# Patient Record
Sex: Female | Born: 1985 | Race: White | Hispanic: No | Marital: Married | Smoking: Never smoker
Health system: Southern US, Community
[De-identification: ages and names within clinical notes are randomized; demographics above are authoritative.]

## PROBLEM LIST (undated history)

## (undated) DIAGNOSIS — K900 Celiac disease: Secondary | ICD-10-CM

## (undated) DIAGNOSIS — D649 Anemia, unspecified: Secondary | ICD-10-CM

## (undated) DIAGNOSIS — Z789 Other specified health status: Secondary | ICD-10-CM

## (undated) HISTORY — DX: Celiac disease: K90.0

## (undated) HISTORY — DX: Anemia, unspecified: D64.9

---

## 2007-12-11 HISTORY — PX: LASIK: SHX215

## 2014-09-22 ENCOUNTER — Ambulatory Visit (INDEPENDENT_AMBULATORY_CARE_PROVIDER_SITE_OTHER): Payer: Self-pay | Admitting: Surgery

## 2014-09-22 NOTE — H&P (Deleted)
History of Present Illness Imogene Burn. Marieme Mcmackin MD; 09/20/2014 1:48 PM) Patient words: eval left breast.  The patient is a 28 year old female who presents with a complaint of nipple discharge. Referred by Dr. Glendon Axe [facility] for evaluation of left nipple drainage  This is a 28 yo female who has recently moved to the area from [place] who presents with several years of intermittent left bloody nipple discharge. She also has tenderness in the left upper outer quadrant of the breast that seems to cycle with her menstrual cycle. She had previous work-up in [place] including mammograms and ultrasounds which were reportedly normal. She had a small lesion excised from her left nipple that was reportedly benign, but the drainage continues. On 09/08/14, she underwent left breast ultrasound at [facility] that showed 2 hypoechoic masses in the 2:30 position, 4 cm from the nipple. One measures 10 mmx 8 mm and the other 8 mm x 5.7 mm. She is otherwise healthy.  Menarche - age 58 Nulliparous No hormones FH - paternal grandmother LMP - 09/06/14   Other Problems [name], CMA; 09/20/2014 10:34 AM) No pertinent past medical history  Past Surgical History [name], [place]; 09/20/2014 10:34 AM) No pertinent past surgical history  Diagnostic Studies History [name], CMA; 09/20/2014 10:34 AM) Colonoscopy never Mammogram >3 years ago  Allergies [name], CMA; 09/20/2014 10:34 AM) Amoxicillin *PENICILLINS*  Medication History ([name], CMA; 09/20/2014 10:34 AM) No Current Medications  Social History [name], CMA; 09/20/2014 10:34 AM) Caffeine use Coffee, Tea. No alcohol use No drug use Tobacco use Never smoker.  Family History [name], [place]; 09/20/2014 10:34 AM) Breast Cancer Family Members In General.  Pregnancy / Birth History [name], [place]; 09/20/2014 10:34 AM) Age at menarche 40 years. Gravida 0 Irregular periods Para  0  Review of Systems ([name] CMA; 09/20/2014 10:34 AM) General Not Present- Appetite Loss, Chills, Fatigue, Fever, Night Sweats, Weight Gain and Weight Loss. Skin Not Present- Change in Wart/Mole, Dryness, Hives, Jaundice, New Lesions, Non-Healing Wounds, Rash and Ulcer. HEENT Present- Seasonal Allergies, Sinus Pain and Wears glasses/contact lenses. Not Present- Earache, Hearing Loss, Hoarseness, Nose Bleed, Oral Ulcers, Ringing in the Ears, Sore Throat, Visual Disturbances and Yellow Eyes. Respiratory Not Present- Bloody sputum, Chronic Cough, Difficulty Breathing, Snoring and Wheezing. Breast Present- Breast Pain and Nipple Discharge. Not Present- Breast Mass and Skin Changes. Cardiovascular Not Present- Chest Pain, Difficulty Breathing Lying Down, Leg Cramps, Palpitations, Rapid Heart Rate, Shortness of Breath and Swelling of Extremities. Gastrointestinal Not Present- Abdominal Pain, Bloating, Bloody Stool, Change in Bowel Habits, Chronic diarrhea, Constipation, Difficulty Swallowing, Excessive gas, Gets full quickly at meals, Hemorrhoids, Indigestion, Nausea, Rectal Pain and Vomiting. Female Genitourinary Present- Nocturia. Not Present- Frequency, Painful Urination, Pelvic Pain and Urgency. Musculoskeletal Not Present- Back Pain, Joint Pain, Joint Stiffness, Muscle Pain, Muscle Weakness and Swelling of Extremities. Neurological Not Present- Decreased Memory, Fainting, Headaches, Numbness, Seizures, Tingling, Tremor, Trouble walking and Weakness. Psychiatric Not Present- Anxiety, Bipolar, Change in Sleep Pattern, Depression, Fearful and Frequent crying. Endocrine Not Present- Cold Intolerance, Excessive Hunger, Hair Changes, Heat Intolerance, Hot flashes and New Diabetes. Hematology Not Present- Easy Bruising, Excessive bleeding, Gland problems, HIV and Persistent Infections.   Vitals ([name] CMA; 09/20/2014 10:35 AM) 09/20/2014 10:34 AM Weight: 91 lb Height: 63in Body Surface  Area: 1.35 m Body Mass Index: 16.12 kg/m Temp.: 97.70F(Temporal)  Pulse: 74 (Regular)  BP: 112/70 (Sitting, Left Arm, Standard)    Physical Exam [name]  [name] MD; 09/20/2014 1:49 PM) The physical exam findings are as follows: Note:WDWN in NAD HEENT: EOMI, sclera anicteric Neck: No masses, no thyromegaly Lungs: CTA bilaterally; normal respiratory effort Breasts: symmetric; bilateral fibrocystic changes; mildly tender in LUOQ with palpable 1 cm masses; no skin changes Right nipple - no retraction or discharge Left nipple - no retraction; the center of nipple shows an opening with some mild irritation and some crusting; unable to express any discharge today. CV: Regular rate and rhythm; no murmurs Abd: +bowel sounds, soft, non-tender, no masses Ext: Well-perfused; no edema Skin: Warm, dry; no sign of jaundice    Assessment & Plan [name] K. Dylynn Ketner MD; 09/20/2014 1:50 PM) BLOODY DISCHARGE FROM LEFT NIPPLE (611.79  N64.52) Current Plans  Schedule for Surgery Note:Recommend left nipple duct excision for biopsy. 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 The patient and her husband want to think about it and will call us back with their decision about surgery. They have an appointment with their PCP this afternoon and wish to discuss this further.   Imogene Burn. [name], MD, [facility] Surgery  General/ Trauma Surgery  09/22/2014 9:35 PM

## 2014-09-22 NOTE — H&P (Signed)
History of Present Illness Candice Ward. Candice Salonga MD; 09/20/2014 1:48 PM) Patient words: eval left breast.  The patient is a 28 year old female who presents with a complaint of nipple discharge. Referred by Dr. Glendon Ward [facility] for evaluation of left nipple drainage  This is a 28 yo female who has recently moved to the area from [place] who presents with several years of intermittent left bloody nipple discharge. She also has tenderness in the left upper outer quadrant of the breast that seems to cycle with her menstrual cycle. She had previous work-up in [place] including mammograms and ultrasounds which were reportedly normal. She had a small lesion excised from her left nipple that was reportedly benign, but the drainage continues. On 09/08/14, she underwent left breast ultrasound at [facility] that showed 2 hypoechoic masses in the 2:30 position, 4 cm from the nipple. One measures 10 mmx 8 mm and the other 8 mm x 5.7 mm. She is otherwise healthy.  Menarche - age 29 Nulliparous No hormones FH - paternal grandmother LMP - 09/06/14   Other Problems Candice Ward, CMA; 09/20/2014 10:34 AM) No pertinent past medical history  Past Surgical History Candice Ward, Candice Ward; 09/20/2014 10:34 AM) No pertinent past surgical history  Diagnostic Studies History Candice Ward, CMA; 09/20/2014 10:34 AM) Colonoscopy never Mammogram >3 years ago  Allergies Candice Ward, CMA; 09/20/2014 10:34 AM) Amoxicillin *PENICILLINS*  Medication History (Candice Ward, CMA; 09/20/2014 10:34 AM) No Current Medications  Social History Candice Ward, CMA; 09/20/2014 10:34 AM) Caffeine use Coffee, Tea. No alcohol use No drug use Tobacco use Never smoker.  Family History Candice Ward, Candice Ward; 09/20/2014 10:34 AM) Breast Cancer Family Members In General.  Pregnancy / Birth History Candice Ward, Candice Ward; 09/20/2014 10:34 AM) Age at menarche 55 years. Gravida 0 Irregular periods Para  0  Review of Systems (Candice Ward CMA; 09/20/2014 10:34 AM) General Not Present- Appetite Loss, Chills, Fatigue, Fever, Night Sweats, Weight Gain and Weight Loss. Skin Not Present- Change in Wart/Mole, Dryness, Hives, Jaundice, New Lesions, Non-Healing Wounds, Rash and Ulcer. HEENT Present- Seasonal Allergies, Sinus Pain and Wears glasses/contact lenses. Not Present- Earache, Hearing Loss, Hoarseness, Nose Bleed, Oral Ulcers, Ringing in the Ears, Sore Throat, Visual Disturbances and Yellow Eyes. Respiratory Not Present- Bloody sputum, Chronic Cough, Difficulty Breathing, Snoring and Wheezing. Breast Present- Breast Pain and Nipple Discharge. Not Present- Breast Mass and Skin Changes. Cardiovascular Not Present- Chest Pain, Difficulty Breathing Lying Down, Leg Cramps, Palpitations, Rapid Heart Rate, Shortness of Breath and Swelling of Extremities. Gastrointestinal Not Present- Abdominal Pain, Bloating, Bloody Stool, Change in Bowel Habits, Chronic diarrhea, Constipation, Difficulty Swallowing, Excessive gas, Gets full quickly at meals, Hemorrhoids, Indigestion, Nausea, Rectal Pain and Vomiting. Female Genitourinary Present- Nocturia. Not Present- Frequency, Painful Urination, Pelvic Pain and Urgency. Musculoskeletal Not Present- Back Pain, Joint Pain, Joint Stiffness, Muscle Pain, Muscle Weakness and Swelling of Extremities. Neurological Not Present- Decreased Memory, Fainting, Headaches, Numbness, Seizures, Tingling, Tremor, Trouble walking and Weakness. Psychiatric Not Present- Anxiety, Bipolar, Change in Sleep Pattern, Depression, Fearful and Frequent crying. Endocrine Not Present- Cold Intolerance, Excessive Hunger, Hair Changes, Heat Intolerance, Hot flashes and New Diabetes. Hematology Not Present- Easy Bruising, Excessive bleeding, Gland problems, HIV and Persistent Infections.   Vitals (Candice Ward CMA; 09/20/2014 10:35 AM) 09/20/2014 10:34 AM Weight: 91 lb Height: 63in Body Surface  Area: 1.35 m Body Mass Index: 16.12 kg/m Temp.: 97.73F(Temporal)  Pulse: 74 (Regular)  BP: 112/70 (Sitting, Left Arm, Standard)    Physical Exam Candice Ward  Candice Section MD; 09/20/2014 1:49 PM) The physical exam findings are as follows: Note:WDWN in NAD HEENT: EOMI, sclera anicteric Neck: No masses, no thyromegaly Lungs: CTA bilaterally; normal respiratory effort Breasts: symmetric; bilateral fibrocystic changes; mildly tender in LUOQ with palpable 1 cm masses; no skin changes Right nipple - no retraction or discharge Left nipple - no retraction; the center of nipple shows an opening with some mild irritation and some crusting; unable to express any discharge today. CV: Regular rate and rhythm; no murmurs Abd: +bowel sounds, soft, non-tender, no masses Ext: Well-perfused; no edema Skin: Warm, dry; no sign of jaundice    Assessment & Plan Candice Ward K. Candice Henkels MD; 09/20/2014 1:50 PM) BLOODY DISCHARGE FROM LEFT NIPPLE (611.79  N64.52) Current Plans  Schedule for Surgery Note:Recommend left nipple duct excision for biopsy. 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 The patient and her husband want to think about it and will call us back with their decision about surgery.    Candice Ward. Candice Dover, MD, [facility] Surgery  General/ Trauma Surgery  09/22/2014 9:36 PM

## 2014-09-28 ENCOUNTER — Encounter (HOSPITAL_COMMUNITY): Payer: Self-pay | Admitting: Pharmacy Technician

## 2014-10-01 ENCOUNTER — Encounter (HOSPITAL_COMMUNITY): Payer: Self-pay

## 2014-10-01 ENCOUNTER — Encounter (HOSPITAL_COMMUNITY)
Admission: RE | Admit: 2014-10-01 | Discharge: 2014-10-01 | Disposition: A | Discharge status: Home / Self Care | Payer: Medicaid Other | Source: Ambulatory Visit | Attending: Surgery | Admitting: Surgery

## 2014-10-01 DIAGNOSIS — Z01812 Encounter for preprocedural laboratory examination: Secondary | ICD-10-CM | POA: Insufficient documentation

## 2014-10-01 HISTORY — DX: Other specified health status: Z78.9

## 2014-10-01 LAB — CBC
HCT: 34.5 % — ABNORMAL LOW (ref 36.0–46.0)
Hemoglobin: 11.2 g/dL — ABNORMAL LOW (ref 12.0–15.0)
MCH: 27.5 pg (ref 26.0–34.0)
MCHC: 32.5 g/dL (ref 30.0–36.0)
MCV: 84.8 fL (ref 78.0–100.0)
Platelets: 225 10*3/uL (ref 150–400)
RBC: 4.07 MIL/uL (ref 3.87–5.11)
RDW: 13.5 % (ref 11.5–15.5)
WBC: 8.3 10*3/uL (ref 4.0–10.5)

## 2014-10-01 LAB — HCG, SERUM, QUALITATIVE: Preg, Serum: NEGATIVE

## 2014-10-01 NOTE — Pre-Procedure Instructions (Signed)
Candice Ward  10/01/2014   Your procedure is scheduled on: Tuesday, October 05, 2014  Report to [facility] Admitting at 8:30 AM.  Call this number if you have problems the morning of surgery: [phone]   Remember:   Do not eat food or drink liquids after midnight Monday, October 04, 2014   Take these medicines the morning of surgery with A SIP OF WATER:   fluticasone (FLONASE) nasal spray  Stop taking Aspirin, vitamins, and herbal medications. Do not take any NSAIDs ie: Ibuprofen, Advil, Naproxen or any medication containing Aspirin; stop now.   Do not wear jewelry, make-up or nail polish.  Do not wear lotions, powders, or perfumes. You may not wear deodorant.  Do not shave 48 hours prior to surgery.   Do not bring valuables to the hospital.  [facility] is not responsible  for any belongings or valuables.               Contacts, dentures or bridgework may not be worn into surgery.  Leave suitcase in the car. After surgery it may be brought to your room.  For patients admitted to the hospital, discharge time is determined by your  treatment team.               Patients discharged the day of surgery will not be allowed to drive home.  Name and phone number of your driver:   Special Instructions:  Special Instructions:Special Instructions: [facility] - Preparing for Surgery  Before surgery, you can play an important role.  Because skin is not sterile, your skin needs to be as free of germs as possible.  You can reduce the number of germs on you skin by washing with CHG (chlorahexidine gluconate) soap before surgery.  CHG is an antiseptic cleaner which kills germs and bonds with the skin to continue killing germs even after washing.  Please DO NOT use if you have an allergy to CHG or antibacterial soaps.  If your skin becomes reddened/irritated stop using the CHG and inform your nurse when you arrive at Short Stay.  Do not shave (including legs and underarms) for at  least 48 hours prior to the first CHG shower.  You may shave your face.  Please follow these instructions carefully:   1.  Shower with CHG Soap the night before surgery and the morning of Surgery.  2.  If you choose to wash your hair, wash your hair first as usual with your normal shampoo.  3.  After you shampoo, rinse your hair and body thoroughly to remove the Shampoo.  4.  Use CHG as you would any other liquid soap.  You can apply chg directly  to the skin and wash gently with scrungie or a clean washcloth.  5.  Apply the CHG Soap to your body ONLY FROM THE NECK DOWN.  Do not use on open wounds or open sores.  Avoid contact with your eyes, ears, mouth and genitals (private parts).  Wash genitals (private parts) with your normal soap.  6.  Wash thoroughly, paying special attention to the area where your surgery will be performed.  7.  Thoroughly rinse your body with warm water from the neck down.  8.  DO NOT shower/wash with your normal soap after using and rinsing off the CHG Soap.  9.  Pat yourself dry with a clean towel.            10.  Wear clean pajamas.  11.  Place clean sheets on your bed the night of your first shower and do not sleep with pets.  Day of Surgery  Do not apply any lotions/deodorants the morning of surgery.  Please wear clean clothes to the hospital/surgery center.   Please read over the following fact sheets that you were given: Pain Booklet, Coughing and Deep Breathing and Surgical Site Infection Prevention

## 2014-10-04 MED ORDER — CEFAZOLIN SODIUM-DEXTROSE 2-3 GM-% IV SOLR
2.0000 g | INTRAVENOUS | Status: AC
  Administered 2014-10-05: 2 g via INTRAVENOUS
  Filled 2014-10-04: qty 50

## 2014-10-05 ENCOUNTER — Ambulatory Visit (HOSPITAL_COMMUNITY): Payer: Medicaid Other | Admitting: Certified Registered"

## 2014-10-05 ENCOUNTER — Ambulatory Visit (HOSPITAL_COMMUNITY)
Admission: RE | Admit: 2014-10-05 | Discharge: 2014-10-05 | Disposition: A | Discharge status: Home / Self Care | Payer: Medicaid Other | Source: Ambulatory Visit | Attending: Surgery | Admitting: Surgery

## 2014-10-05 ENCOUNTER — Encounter (HOSPITAL_COMMUNITY): Payer: Medicaid Other | Admitting: Certified Registered"

## 2014-10-05 ENCOUNTER — Encounter (HOSPITAL_COMMUNITY)
Admission: RE | Disposition: A | Discharge status: Home / Self Care | Payer: Self-pay | Source: Ambulatory Visit | Attending: Surgery

## 2014-10-05 ENCOUNTER — Encounter (HOSPITAL_COMMUNITY): Payer: Self-pay | Admitting: *Deleted

## 2014-10-05 DIAGNOSIS — Z880 Allergy status to penicillin: Secondary | ICD-10-CM | POA: Diagnosis not present

## 2014-10-05 DIAGNOSIS — D242 Benign neoplasm of left breast: Secondary | ICD-10-CM | POA: Insufficient documentation

## 2014-10-05 DIAGNOSIS — N6452 Nipple discharge: Secondary | ICD-10-CM | POA: Insufficient documentation

## 2014-10-05 DIAGNOSIS — Z881 Allergy status to other antibiotic agents status: Secondary | ICD-10-CM | POA: Insufficient documentation

## 2014-10-05 HISTORY — PX: BREAST DUCTAL SYSTEM EXCISION: SHX5242

## 2014-10-05 SURGERY — EXCISION DUCTAL SYSTEM BREAST
Anesthesia: General | Site: Breast | Laterality: Left

## 2014-10-05 MED ORDER — PROPOFOL 10 MG/ML IV BOLUS
INTRAVENOUS | Status: AC
  Filled 2014-10-05: qty 20

## 2014-10-05 MED ORDER — PROMETHAZINE HCL 25 MG/ML IJ SOLN: 6.2500 mg | INTRAMUSCULAR | Status: DC | PRN

## 2014-10-05 MED ORDER — ONDANSETRON HCL 4 MG/2ML IJ SOLN: 4.0000 mg | INTRAMUSCULAR | Status: DC | PRN

## 2014-10-05 MED ORDER — DEXAMETHASONE SODIUM PHOSPHATE 10 MG/ML IJ SOLN
INTRAMUSCULAR | Status: DC | PRN
  Administered 2014-10-05: 4 mg via INTRAVENOUS

## 2014-10-05 MED ORDER — SUCCINYLCHOLINE CHLORIDE 20 MG/ML IJ SOLN
INTRAMUSCULAR | Status: AC
  Filled 2014-10-05: qty 1

## 2014-10-05 MED ORDER — OXYCODONE HCL 5 MG PO TABS: 5.0000 mg | ORAL_TABLET | Freq: Once | ORAL | Status: DC | PRN

## 2014-10-05 MED ORDER — MORPHINE SULFATE 2 MG/ML IJ SOLN: 2.0000 mg | INTRAMUSCULAR | Status: DC | PRN

## 2014-10-05 MED ORDER — MIDAZOLAM HCL 2 MG/2ML IJ SOLN
INTRAMUSCULAR | Status: AC
  Filled 2014-10-05: qty 2

## 2014-10-05 MED ORDER — FENTANYL CITRATE 0.05 MG/ML IJ SOLN
INTRAMUSCULAR | Status: DC | PRN
  Administered 2014-10-05 (×4): 25 ug via INTRAVENOUS

## 2014-10-05 MED ORDER — ROCURONIUM BROMIDE 50 MG/5ML IV SOLN
INTRAVENOUS | Status: AC
  Filled 2014-10-05: qty 1

## 2014-10-05 MED ORDER — LACTATED RINGERS IV SOLN
INTRAVENOUS | Status: DC | PRN
  Administered 2014-10-05: 10:00:00 via INTRAVENOUS

## 2014-10-05 MED ORDER — ONDANSETRON HCL 4 MG/2ML IJ SOLN
INTRAMUSCULAR | Status: DC | PRN
Start: 2014-10-05 — End: 2014-10-05
  Administered 2014-10-05: 4 mg via INTRAVENOUS

## 2014-10-05 MED ORDER — HYDROCODONE-ACETAMINOPHEN 5-325 MG PO TABS: 1.0000 | ORAL_TABLET | ORAL | Status: DC | PRN

## 2014-10-05 MED ORDER — OXYCODONE HCL 5 MG/5ML PO SOLN: 5.0000 mg | Freq: Once | ORAL | Status: DC | PRN

## 2014-10-05 MED ORDER — LIDOCAINE HCL (CARDIAC) 20 MG/ML IV SOLN
INTRAVENOUS | Status: DC | PRN
  Administered 2014-10-05: 80 mg via INTRAVENOUS

## 2014-10-05 MED ORDER — BUPIVACAINE HCL (PF) 0.25 % IJ SOLN
INTRAMUSCULAR | Status: AC
  Filled 2014-10-05: qty 30

## 2014-10-05 MED ORDER — BUPIVACAINE HCL (PF) 0.25 % IJ SOLN
INTRAMUSCULAR | Status: DC | PRN
  Administered 2014-10-05: 10 mL

## 2014-10-05 MED ORDER — ONDANSETRON HCL 4 MG/2ML IJ SOLN
INTRAMUSCULAR | Status: AC
  Filled 2014-10-05: qty 2

## 2014-10-05 MED ORDER — PHENYLEPHRINE HCL 10 MG/ML IJ SOLN
INTRAMUSCULAR | Status: DC | PRN
  Administered 2014-10-05: 40 ug via INTRAVENOUS

## 2014-10-05 MED ORDER — LACTATED RINGERS IV SOLN
INTRAVENOUS | Status: DC
  Administered 2014-10-05: 50 mL/h via INTRAVENOUS

## 2014-10-05 MED ORDER — FENTANYL CITRATE 0.05 MG/ML IJ SOLN
25.0000 ug | INTRAMUSCULAR | Status: DC | PRN
  Administered 2014-10-05 (×2): 25 ug via INTRAVENOUS

## 2014-10-05 MED ORDER — CHLORHEXIDINE GLUCONATE 4 % EX LIQD
1.0000 "application " | Freq: Once | CUTANEOUS | Status: DC
  Filled 2014-10-05: qty 15

## 2014-10-05 MED ORDER — FENTANYL CITRATE 0.05 MG/ML IJ SOLN
INTRAMUSCULAR | Status: AC
  Filled 2014-10-05: qty 5

## 2014-10-05 MED ORDER — 0.9 % SODIUM CHLORIDE (POUR BTL) OPTIME
TOPICAL | Status: DC | PRN
  Administered 2014-10-05: 1000 mL

## 2014-10-05 MED ORDER — FENTANYL CITRATE 0.05 MG/ML IJ SOLN
INTRAMUSCULAR | Status: AC
  Filled 2014-10-05: qty 2

## 2014-10-05 MED ORDER — BUPIVACAINE-EPINEPHRINE (PF) 0.25% -1:200000 IJ SOLN
INTRAMUSCULAR | Status: AC
  Filled 2014-10-05: qty 30

## 2014-10-05 MED ORDER — MIDAZOLAM HCL 5 MG/5ML IJ SOLN
INTRAMUSCULAR | Status: DC | PRN
  Administered 2014-10-05: 2 mg via INTRAVENOUS

## 2014-10-05 MED ORDER — PROPOFOL 10 MG/ML IV BOLUS
INTRAVENOUS | Status: DC | PRN
  Administered 2014-10-05: 150 mg via INTRAVENOUS

## 2014-10-05 SURGICAL SUPPLY — 46 items
BENZOIN TINCTURE PRP APPL 2/3 (GAUZE/BANDAGES/DRESSINGS) ×3 IMPLANT
CANISTER SUCTION 2500CC (MISCELLANEOUS) ×3 IMPLANT
CHLORAPREP W/TINT 26ML (MISCELLANEOUS) ×3 IMPLANT
CLOSURE WOUND 1/2 X4 (GAUZE/BANDAGES/DRESSINGS) ×1
CONT SPEC 4OZ CLIKSEAL STRL BL (MISCELLANEOUS) ×3 IMPLANT
COVER SURGICAL LIGHT HANDLE (MISCELLANEOUS) ×3 IMPLANT
DRAPE LAPAROSCOPIC ABDOMINAL (DRAPES) ×3 IMPLANT
DRAPE PED LAPAROTOMY (DRAPES) ×3 IMPLANT
DRAPE UTILITY 15X26 W/TAPE STR (DRAPE) ×6 IMPLANT
DRSG TEGADERM 4X4.75 (GAUZE/BANDAGES/DRESSINGS) ×3 IMPLANT
ELECT CAUTERY BLADE 6.4 (BLADE) ×3 IMPLANT
ELECT REM PT RETURN 9FT ADLT (ELECTROSURGICAL) ×3
ELECTRODE REM PT RTRN 9FT ADLT (ELECTROSURGICAL) ×1 IMPLANT
GAUZE SPONGE 2X2 8PLY STRL LF (GAUZE/BANDAGES/DRESSINGS) ×1 IMPLANT
GLOVE BIO SURGEON STRL SZ7 (GLOVE) ×3 IMPLANT
GLOVE BIOGEL PI IND STRL 7.0 (GLOVE) ×3 IMPLANT
GLOVE BIOGEL PI IND STRL 7.5 (GLOVE) ×1 IMPLANT
GLOVE BIOGEL PI INDICATOR 7.0 (GLOVE) ×6
GLOVE BIOGEL PI INDICATOR 7.5 (GLOVE) ×2
GLOVE SURG SS PI 7.0 STRL IVOR (GLOVE) ×6 IMPLANT
GOWN STRL REUS W/ TWL LRG LVL3 (GOWN DISPOSABLE) ×2 IMPLANT
GOWN STRL REUS W/TWL LRG LVL3 (GOWN DISPOSABLE) ×4
KIT BASIN OR (CUSTOM PROCEDURE TRAY) ×3 IMPLANT
KIT MARKER MARGIN INK (KITS) ×3 IMPLANT
KIT ROOM TURNOVER OR (KITS) ×3 IMPLANT
NEEDLE HYPO 25GX1X1/2 BEV (NEEDLE) ×3 IMPLANT
NS IRRIG 1000ML POUR BTL (IV SOLUTION) ×3 IMPLANT
PACK SURGICAL SETUP 50X90 (CUSTOM PROCEDURE TRAY) ×3 IMPLANT
PAD ARMBOARD 7.5X6 YLW CONV (MISCELLANEOUS) ×3 IMPLANT
PENCIL BUTTON HOLSTER BLD 10FT (ELECTRODE) ×3 IMPLANT
SPECIMEN JAR MEDIUM (MISCELLANEOUS) ×3 IMPLANT
SPONGE GAUZE 2X2 STER 10/PKG (GAUZE/BANDAGES/DRESSINGS) ×2
SPONGE LAP 4X18 X RAY DECT (DISPOSABLE) ×3 IMPLANT
STRIP CLOSURE SKIN 1/2X4 (GAUZE/BANDAGES/DRESSINGS) ×2 IMPLANT
SUT MON AB 4-0 PC3 18 (SUTURE) ×3 IMPLANT
SUT MON AB 5-0 P3 18 (SUTURE) ×3 IMPLANT
SUT VIC AB 3-0 SH 27 (SUTURE) ×2
SUT VIC AB 3-0 SH 27X BRD (SUTURE) ×1 IMPLANT
SYR BULB 3OZ (MISCELLANEOUS) ×3 IMPLANT
SYR CONTROL 10ML LL (SYRINGE) ×3 IMPLANT
TOWEL OR 17X24 6PK STRL BLUE (TOWEL DISPOSABLE) IMPLANT
TOWEL OR 17X26 10 PK STRL BLUE (TOWEL DISPOSABLE) ×3 IMPLANT
TUBE CONNECTING 12'X1/4 (SUCTIONS) ×1
TUBE CONNECTING 12X1/4 (SUCTIONS) ×2 IMPLANT
WATER STERILE IRR 1000ML POUR (IV SOLUTION) IMPLANT
YANKAUER SUCT BULB TIP NO VENT (SUCTIONS) ×3 IMPLANT

## 2014-10-05 NOTE — Anesthesia Procedure Notes (Addendum)
Procedure Name: LMA Insertion Date/Time: 10/05/2014 10:31 AM Performed by: Maeola Harman Pre-anesthesia Checklist: Patient identified, Emergency Drugs available, Suction available, Patient being monitored and Timeout performed Patient Re-evaluated:Patient Re-evaluated prior to inductionOxygen Delivery Method: Circle system utilized Preoxygenation: Pre-oxygenation with 100% oxygen Intubation Type: IV induction Ventilation: Mask ventilation without difficulty LMA: LMA inserted LMA Size: 4.0 Tube type: Oral Number of attempts: 1 Placement Confirmation: breath sounds checked- equal and bilateral and positive ETCO2 Tube secured with: Tape Dental Injury: Teeth and Oropharynx as per pre-operative assessment  Comments: Easy induction and LMA insertion #4.  Dr Orene Desanctis verified placement.  Waldron Session, CRNA

## 2014-10-05 NOTE — H&P (Signed)
History of Present Illness  Patient words: eval left breast.  The patient is a 28 year old female who presents with a complaint of nipple discharge. Referred by Dr. Glendon Axe [facility] for evaluation of left nipple drainage  This is a 28 yo female who has recently moved to the area from [place] who presents with several years of intermittent left bloody nipple discharge. She also has tenderness in the left upper outer quadrant of the breast that seems to cycle with her menstrual cycle. She had previous work-up in [place] including mammograms and ultrasounds which were reportedly normal. She had a small lesion excised from her left nipple that was reportedly benign, but the drainage continues. On 09/08/14, she underwent left breast ultrasound at [facility] that showed 2 hypoechoic masses in the 2:30 position, 4 cm from the nipple. One measures 10 mmx 8 mm and the other 8 mm x 5.7 mm. She is otherwise healthy.  Menarche - age 39 Nulliparous No hormones FH - paternal grandmother LMP - 09/06/14   Other Problems [name], CMA; 09/20/2014 10:34 AM) No pertinent past medical history  Past Surgical History [name], [place]; 09/20/2014 10:34 AM) No pertinent past surgical history  Diagnostic Studies History [name], CMA; 09/20/2014 10:34 AM) Colonoscopy never Mammogram >3 years ago  Allergies [name], CMA; 09/20/2014 10:34 AM) Amoxicillin *PENICILLINS*  Medication History ([name], CMA; 09/20/2014 10:34 AM) No Current Medications  Social History [name], CMA; 09/20/2014 10:34 AM) Caffeine use Coffee, Tea. No alcohol use No drug use Tobacco use Never smoker.  Family History [name], [place]; 09/20/2014 10:34 AM) Breast Cancer Family Members In General.  Pregnancy / Birth History [name], [place]; 09/20/2014 10:34 AM) Age at menarche 79 years. Gravida 0 Irregular periods Para 0  Review of Systems ([name] CMA;  09/20/2014 10:34 AM) General Not Present- Appetite Loss, Chills, Fatigue, Fever, Night Sweats, Weight Gain and Weight Loss. Skin Not Present- Change in Wart/Mole, Dryness, Hives, Jaundice, New Lesions, Non-Healing Wounds, Rash and Ulcer. HEENT Present- Seasonal Allergies, Sinus Pain and Wears glasses/contact lenses. Not Present- Earache, Hearing Loss, Hoarseness, Nose Bleed, Oral Ulcers, Ringing in the Ears, Sore Throat, Visual Disturbances and Yellow Eyes. Respiratory Not Present- Bloody sputum, Chronic Cough, Difficulty Breathing, Snoring and Wheezing. Breast Present- Breast Pain and Nipple Discharge. Not Present- Breast Mass and Skin Changes. Cardiovascular Not Present- Chest Pain, Difficulty Breathing Lying Down, Leg Cramps, Palpitations, Rapid Heart Rate, Shortness of Breath and Swelling of Extremities. Gastrointestinal Not Present- Abdominal Pain, Bloating, Bloody Stool, Change in Bowel Habits, Chronic diarrhea, Constipation, Difficulty Swallowing, Excessive gas, Gets full quickly at meals, Hemorrhoids, Indigestion, Nausea, Rectal Pain and Vomiting. Female Genitourinary Present- Nocturia. Not Present- Frequency, Painful Urination, Pelvic Pain and Urgency. Musculoskeletal Not Present- Back Pain, Joint Pain, Joint Stiffness, Muscle Pain, Muscle Weakness and Swelling of Extremities. Neurological Not Present- Decreased Memory, Fainting, Headaches, Numbness, Seizures, Tingling, Tremor, Trouble walking and Weakness. Psychiatric Not Present- Anxiety, Bipolar, Change in Sleep Pattern, Depression, Fearful and Frequent crying. Endocrine Not Present- Cold Intolerance, Excessive Hunger, Hair Changes, Heat Intolerance, Hot flashes and New Diabetes. Hematology Not Present- Easy Bruising, Excessive bleeding, Gland problems, HIV and Persistent Infections.   Vitals ([name] CMA; 09/20/2014 10:35 AM) 09/20/2014 10:34 AM Weight: 91 lb Height: 63in Body Surface Area: 1.35 m Body Mass Index: 16.12  kg/m Temp.: 97.90F(Temporal)  Pulse: 74 (Regular)  BP: 112/70 (Sitting, Left Arm, Standard)    Physical Exam [name] K. Toneshia Coello MD; 09/20/2014 1:49 PM)  The physical exam findings are as follows: Note:WDWN in NAD HEENT: EOMI, sclera anicteric Neck: No masses, no thyromegaly Lungs: CTA bilaterally; normal respiratory effort Breasts: symmetric; bilateral fibrocystic changes; mildly tender in LUOQ with palpable 1 cm masses; no skin changes Right nipple - no retraction or discharge Left nipple - no retraction; the center of nipple shows an opening with some mild irritation and some crusting; unable to express any discharge today. CV: Regular rate and rhythm; no murmurs Abd: +bowel sounds, soft, non-tender, no masses Ext: Well-perfused; no edema Skin: Warm, dry; no sign of jaundice    Assessment & Plan [name] K. Annabel Gibeau MD; 09/20/2014 1:50 PM) BLOODY DISCHARGE FROM LEFT NIPPLE (611.79  N64.52) Current Plans  Schedule for Surgery Note:Recommend left nipple duct excision for biopsy. 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  Our main concern is to determine the cause of her nipple discharge.  We will proceed only with left nipple duct excision.  The surgical procedure has been discussed with the patient.  Potential risks, benefits, alternative treatments, and expected outcomes have been explained.  All of the patient's questions at this time have been answered.  The likelihood of reaching the patient's treatment goal is good.  The patient understand the proposed surgical procedure and wishes to proceed.   Imogene Burn. [name], MD, [facility] Surgery  General/ Trauma Surgery  10/05/2014 10:16 AM

## 2014-10-05 NOTE — Anesthesia Preprocedure Evaluation (Addendum)
Anesthesia Evaluation  Patient identified by MRN, date of birth, ID band Patient awake    Reviewed: Allergy & Precautions, H&P , NPO status , Patient's Chart, lab work & pertinent test results, reviewed documented beta blocker date and time   History of Anesthesia Complications Negative for: history of anesthetic complications  Airway Mallampati: I   Neck ROM: Full    Dental  (+) Teeth Intact, Dental Advisory Given   Pulmonary neg pulmonary ROS,  breath sounds clear to auscultation        Cardiovascular negative cardio ROS  Rhythm:Regular Rate:Normal     Neuro/Psych negative neurological ROS  negative psych ROS   GI/Hepatic negative GI ROS, Neg liver ROS,   Endo/Other  negative endocrine ROS  Renal/GU negative Renal ROS  negative genitourinary   Musculoskeletal negative musculoskeletal ROS (+)   Abdominal   Peds  Hematology negative hematology ROS (+)   Anesthesia Other Findings   Reproductive/Obstetrics                          Anesthesia Physical Anesthesia Plan  ASA: I  Anesthesia Plan: General   Post-op Pain Management:    Induction: Intravenous  Airway Management Planned: LMA  Additional Equipment:   Intra-op Plan:   Post-operative Plan: Extubation in OR  Informed Consent: I have reviewed the patients History and Physical, chart, labs and discussed the procedure including the risks, benefits and alternatives for the proposed anesthesia with the patient or authorized representative who has indicated his/her understanding and acceptance.   Dental advisory given  Plan Discussed with: CRNA and Surgeon  Anesthesia Plan Comments:         Anesthesia Quick Evaluation

## 2014-10-05 NOTE — Discharge Instructions (Signed)
[place] Office Phone Number [phone]  BREAST BIOPSY/ PARTIAL MASTECTOMY: POST OP INSTRUCTIONS  Always review your discharge instruction sheet given to you by the facility where your surgery was performed.  IF YOU HAVE DISABILITY OR FAMILY LEAVE FORMS, YOU MUST BRING THEM TO THE OFFICE FOR PROCESSING.  DO NOT GIVE THEM TO YOUR DOCTOR.  1. A prescription for pain medication may be given to you upon discharge.  Take your pain medication as prescribed, if needed.  If narcotic pain medicine is not needed, then you may take acetaminophen (Tylenol) or ibuprofen (Advil) as needed. 2. Take your usually prescribed medications unless otherwise directed 3. If you need a refill on your pain medication, please contact your pharmacy.  They will contact our office to request authorization.  Prescriptions will not be filled after 5pm or on week-ends. 4. You should eat very light the first 24 hours after surgery, such as soup, crackers, pudding, etc.  Resume your normal diet the day after surgery. 5. Most patients will experience some swelling and bruising in the breast.  Ice packs and a good support bra will help.  Swelling and bruising can take several days to resolve.  6. It is common to experience some constipation if taking pain medication after surgery.  Increasing fluid intake and taking a stool softener will usually help or prevent this problem from occurring.  A mild laxative (Milk of Magnesia or Miralax) should be taken according to package directions if there are no bowel movements after 48 hours. 7. Unless discharge instructions indicate otherwise, you may remove your bandages 48 hours after surgery, and you may shower at that time.  You will have steri-strips (small skin tapes) in place directly over the incision.  These strips should be left on the skin for 7-10 days.   Any sutures or staples will be removed at the office during your follow-up visit. 8. ACTIVITIES:  You may resume  regular daily activities (gradually increasing) beginning the next day.  Wearing a good support bra or sports bra minimizes pain and swelling.  You may have sexual intercourse when it is comfortable. a. You may drive when you no longer are taking prescription pain medication, you can comfortably wear a seatbelt, and you can safely maneuver your car and apply brakes. b. RETURN TO WORK:  1-2 weeks 9. You should see your doctor in the office for a follow-up appointment approximately two weeks after your surgery.  Your doctors nurse will typically make your follow-up appointment when she calls you with your pathology report.  Expect your pathology report 2-3 business days after your surgery.  You may call to check if you do not hear from [place] after three days. 10. OTHER INSTRUCTIONS: _______________________________________________________________________________________________ _____________________________________________________________________________________________________________________________________ _____________________________________________________________________________________________________________________________________ _____________________________________________________________________________________________________________________________________  WHEN TO CALL YOUR DOCTOR: 1. Fever over 101.0 2. Nausea and/or vomiting. 3. Extreme swelling or bruising. 4. Continued bleeding from incision. 5. Increased pain, redness, or drainage from the incision.  The clinic staff is available to answer your questions during regular business hours.  Please dont hesitate to call and ask to speak to one of the nurses for clinical concerns.  If you have a medical emergency, go to the nearest emergency room or call 911.  A surgeon from [facility] is always on call at the hospital.  For further questions, please visit [email or web address]      What to eat:  For your first  meals, you should eat lightly; only small meals initially.  If you do not  have nausea, you may eat larger meals.  Avoid spicy, greasy and heavy food.    General Anesthesia, Adult, Care After  Refer to this sheet in the next few weeks. These instructions provide you with information on caring for yourself after your procedure. Your health care provider may also give you more specific instructions. Your treatment has been planned according to current medical practices, but problems sometimes occur. Call your health care provider if you have any problems or questions after your procedure.  WHAT TO EXPECT AFTER THE PROCEDURE  After the procedure, it is typical to experience:  Sleepiness.  Nausea and vomiting. HOME CARE INSTRUCTIONS  For the first 24 hours after general anesthesia:  Have a responsible person with you.  Do not drive a car. If you are alone, do not take public transportation.  Do not drink alcohol.  Do not take medicine that has not been prescribed by your health care provider.  Do not sign important papers or make important decisions.  You may resume a normal diet and activities as directed by your health care provider.  Change bandages (dressings) as directed.  If you have questions or problems that seem related to general anesthesia, call the hospital and ask for the anesthetist or anesthesiologist on call. SEEK MEDICAL CARE IF:  You have nausea and vomiting that continue the day after anesthesia.  You develop a rash. SEEK IMMEDIATE MEDICAL CARE IF:  You have difficulty breathing.  You have chest pain.  You have any allergic problems. Document Released: 03/04/2001 Document Revised: 07/29/2013 Document Reviewed: 06/11/2013  [facility] Patient Information 2014 [place], [place].

## 2014-10-05 NOTE — Anesthesia Postprocedure Evaluation (Signed)
  Anesthesia Post-op Note  Patient: Candice Ward  Procedure(s) Performed: Procedure(s): LEFT NIPPLE DUCT EXCISION (Left)  Patient Location: PACU  Anesthesia Type:General  Level of Consciousness: awake and alert   Airway and Oxygen Therapy: Patient Spontanous Breathing  Post-op Pain: mild  Post-op Assessment: Post-op Vital signs reviewed  Post-op Vital Signs: stable  Last Vitals:  Filed Vitals:   10/05/14 1145  BP: 108/71  Pulse: 66  Temp:   Resp: 13    Complications: No apparent anesthesia complications

## 2014-10-05 NOTE — Op Note (Signed)
Preop diagnosis: Left nipple duct bloody discharge Postop diagnosis: Same Procedure: Left nipple duct excision Surgeon:[place] K. Anesthesia: Gen. LMA Indications: This is a 28 year old female who presents with several years of intermittent left bloody nipple discharge. [place] and ultrasounds which were reportedly normal. She presents now for nipple duct excision for persistent bloody drainage.  Description of procedure: The patient is brought to the operating room and placed in the supine position on the operating room table. After an adequate level of general anesthesia was obtained, her left breast was prepped with ChloraPrep and draped sterile fashion. A timeout was taken to ensure the proper patient proper procedure. There is a dilated duct opening in the lower central part of her left nipple. This was cannulated with a lacrimal duct probe. The seen to track into the lower part of her nipple. We made a curvilinear incision around the edge of the areolea in the lower outer quadrant.  I dissected behind the nipple until we encountered the lacrimal duct probe. We grasped this single duct with a Allis clamp. The probe was removed. I then excised a cone of breast tissue oriented in the inferior posterior direction. We went down for a depth of about 3 cm. The specimen was removed and oriented with a paint kit. The dilated ductal opening was excised and sent as a separate specimen. The nipple was closed with 5-0 Monocryl and a pursestring fashion. The biopsy cavity was inspected for hemostasis and was closed with 3-0 Vicryl as well as 4-0 Monocryl. Steri-Strips and clean dressings were applied. The patient was then next made about the recovery room in stable condition. All sponge, initially, and needle counts are correct.  Imogene Burn. [name], MD, [facility]  General/ Trauma Surgery  10/05/2014 11:24 AM

## 2014-10-05 NOTE — Transfer of Care (Signed)
Immediate Anesthesia Transfer of Care Note  Patient: Candice Ward  Procedure(s) Performed: Procedure(s): LEFT NIPPLE DUCT EXCISION (Left)  Patient Location: PACU  Anesthesia Type:General  Level of Consciousness: awake, alert  and oriented  Airway & Oxygen Therapy: Patient connected to face mask oxygen  Post-op Assessment: Report given to PACU RN  Post vital signs: stable  Complications: No apparent anesthesia complications

## 2014-10-06 ENCOUNTER — Encounter (HOSPITAL_COMMUNITY): Payer: Self-pay | Admitting: Surgery

## 2016-07-04 ENCOUNTER — Encounter: Payer: Self-pay | Admitting: Obstetrics & Gynecology

## 2016-07-04 ENCOUNTER — Ambulatory Visit (INDEPENDENT_AMBULATORY_CARE_PROVIDER_SITE_OTHER): Payer: BLUE CROSS/BLUE SHIELD

## 2016-07-04 DIAGNOSIS — Z3201 Encounter for pregnancy test, result positive: Secondary | ICD-10-CM

## 2016-07-04 DIAGNOSIS — Z3200 Encounter for pregnancy test, result unknown: Secondary | ICD-10-CM

## 2016-07-04 LAB — POCT PREGNANCY, URINE: PREG TEST UR: POSITIVE — AB

## 2016-07-04 NOTE — Progress Notes (Signed)
Patient presented today for a pregnancy test. Test confirms she is currently pregnant around 6 weeks. Patient plans to start care at other office. A letter of pregnancy verification has been given to patient.

## 2017-01-22 DIAGNOSIS — D242 Benign neoplasm of left breast: Secondary | ICD-10-CM

## 2017-01-22 HISTORY — DX: Benign neoplasm of left breast: D24.2

## 2019-11-09 ENCOUNTER — Ambulatory Visit (HOSPITAL_COMMUNITY): Payer: 59 | Attending: Obstetrics and Gynecology | Admitting: Obstetrics

## 2019-11-09 ENCOUNTER — Other Ambulatory Visit: Payer: Self-pay

## 2019-11-09 DIAGNOSIS — O09293 Supervision of pregnancy with other poor reproductive or obstetric history, third trimester: Secondary | ICD-10-CM

## 2019-11-09 DIAGNOSIS — O09299 Supervision of pregnancy with other poor reproductive or obstetric history, unspecified trimester: Secondary | ICD-10-CM

## 2019-11-09 DIAGNOSIS — O09219 Supervision of pregnancy with history of pre-term labor, unspecified trimester: Secondary | ICD-10-CM

## 2019-11-09 DIAGNOSIS — Z3169 Encounter for other general counseling and advice on procreation: Secondary | ICD-10-CM

## 2019-11-09 DIAGNOSIS — Z3A28 28 weeks gestation of pregnancy: Secondary | ICD-10-CM | POA: Diagnosis not present

## 2019-11-09 DIAGNOSIS — O09213 Supervision of pregnancy with history of pre-term labor, third trimester: Secondary | ICD-10-CM

## 2019-11-10 NOTE — Progress Notes (Signed)
MFM Consult  Candice Ward was seen for a virtual preconception consultation due to a prior pregnancy that was complicated by placental abruption with delivery at around 28 weeks.  She is a 33 year old gravida 1 para 0-1-0-1.  Her prior pregnancy was conceived through in vitro fertilization.  She reports that sometime at around 25 weeks, she woke up with cramping.  By 26 weeks, she began to experience vaginal bleeding along with abdominal pain at which point she was diagnosed with a partial placental abruption and received a complete course of antenatal corticosteroids.  She reports that at 27 weeks, there were some retroplacental clots noted on her ultrasound.  At 28 weeks, her cervix began to dilate and she subsequently had a vaginal delivery a day after her cervix began to dilate.  The patient delivered at [facility] in [place].  She reports that her child spent about 6 weeks in the NICU following delivery.  The child is doing well today.  Unfortunately, no tests were performed following her delivery and the placenta was not sent for pathologic analysis.  The patient denies any risk factors for placental abruption in her last pregnancy.  She reports that she did not have elevated blood pressures and did not have preeclampsia.  She did not sustain a fall or trauma to the abdomen.  She did not smoke or use any illegal drugs.  Her membranes did not rupture until close to delivery.  She denies any significant past medical history.  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  She reports 1 prior normal spontaneous vaginal delivery at 28 weeks.  She is not currently taking any medications.  However, in preparation for a future pregnancy, she will start taking a prenatal vitamin and folic acid soon.  The patient and her husband report that they plan on conceiving their next pregnancy through in vitro fertilization  sometime in April to June 2021.  They were seen for a preconception consultation today to discuss the management of her future pregnancy and to avoid another placental abruption.  The patient and her husband were advised that the cause of placental abruption in her last pregnancy remains undetermined.  I could not identify any risk factors for placental abruption based on her history.  They were advised that as she experienced cramping and vaginal bleeding, that possibly contractions related to preterm labor may have contributed to the abruption.  However, it is difficult to assess if there was a component of preterm labor in her last pregnancy as I do not have any records from her prior delivery.  The couple was advised that the risk of placental abruption occurring again in her subsequent pregnancy is probably around 10%.  The couple inquired if there were any blood tests that could be performed to determine if they would be at risk for another abruption.  They were advised that the association between an inherited thrombophilia, the antiphospholipid antibody syndrome, and placental abruption remains weak.  They were advised that the main benefit of a thrombophilia work-up or a work-up for antiphospholipid antibodies is to determine a person's risk for thrombosis.  As she denies any history of a prior thromboembolic event, denies a prior history of an IUFD or fetal growth restriction, a work-up for thrombophilia or antiphospholipid antibodies is probably not indicated.   The couple also inquired if there were any medications that she can take to prevent a placental abruption.  They were advised that there are no medications to prevent a placental abruption.  However, I advised the couple that it would not do any harm for her to continue taking a daily baby aspirin for the duration of her pregnancy.  They were advised that the main indication for treatment with a daily baby aspirin is for preeclampsia  prophylaxis, however continuing the daily baby aspirin for the duration of her future pregnancy will probably not cause harm.  As the patient reports that she will be placed on progesterone supplementation early in the first trimester to support the IVF pregnancy, the couple inquired if continuing her on progesterone for the duration of her pregnancy would improve her pregnancy outcome.  They were advised that weekly injections of 17 alpha hydroxyprogesterone caproate (17-P) has been shown to decrease the risk of a subsequent preterm birth in someone with a history of a prior preterm birth.  As it is uncertain if spontaneous preterm labor played a role in the placental abruption in her last pregnancy, the patient was advised that it is unknown if weekly 17-P injections starting at 16 weeks of her future pregnancy and continued until 36 weeks would decrease her risk of a placental abruption in her future pregnancy.  They were advised that the weekly 17-P injections would probably not do any harm.  They will consider getting the weekly 17-P injections starting at 16 weeks in her future pregnancy.  The couple also inquired if a cervical cerclage would help her achieve a better outcome in her future pregnancy.  They were advised that a cervical cerclage would be indicated if her prior preterm birth was due to cervical incompetence.  Based on her history, I do not believe that cervical incompetence played a role in her prior preterm birth.  Therefore, a cervical cerclage based on her history is not recommended.  However, the patient was advised that should progressive cervical shortening be noted in her future pregnancy, an ultrasound indicated cerclage may be recommended.  The couple was reassured that even if no treatment is instituted in her future pregnancy, that their chances of a successful pregnancy outcome is high.  They were advised that we will continue to follow her closely with serial ultrasounds  starting with a first trimester ultrasound to assess the nuchal translucency and placental location at around 10 to 13 weeks.  We will then continue to follow her with biweekly transvaginal cervical length measurements starting at around 16 weeks and continued until 24 weeks.  A cervical cerclage may be considered should progressive cervical shortening be noted during these exams.  A fetal anatomy scan will be scheduled at around 19 weeks.  She will then continue to be followed with serial growth ultrasounds.  Weekly fetal testing using nonstress tests to monitor for fetal wellbeing and for contractions would be recommended starting at around 30 weeks.  Weekly 17-P injections starting at around 16 weeks may be considered as spontaneous preterm labor could not be ruled out as the cause of her 28-week delivery.  The patient was advised that she may take a daily baby aspirin for the duration of her pregnancy should she desire.  The patient and her husband were advised that they may deliver here at [facility] as it is closer to their home. They were advised to contact our office for recommendations for local obstetricians who deliver at [facility] once she conceives her future pregnancy.  They were reassured that we have managed many people in the past  with a similar history and have seen successful outcomes in their subsequent pregnancy.  At the end of the consultation, the patient and her husband stated that all their questions had been answered to their complete satisfaction.    Thank you for referring this very nice patient for a Maternal-Fetal Medicine preconception consultation.  A total of 45 minutes was spent counseling and coordinating the care for this patient.  Greater than 50% of the time was spent in direct face-to-face contact.  Recommendations: A thrombophilia and antiphospholipid antibody work-up is probably not indicated  Once she conceives, she should be referred for:  A first trimester  ultrasound to assess the nuchal translucency and placental location    Biweekly transvaginal ultrasounds at 16-24 weeks for assessment of cervical length   Fetal anatomy scan at around 19 weeks  Monthly growth ultrasounds  Weekly nonstress test for fetal assessment and detection of contractions starting at 30 weeks  Consider taking a daily baby aspirin for the duration of her pregnancy  Consider weekly 17-P injections starting at 16 weeks and continued until 36 weeks

## 2019-12-11 NOTE — L&D Delivery Note (Addendum)
LABOR COURSE PPROM 11/18 @ 0830. Difficulty titrating pit secondary to cat II strip. Spiked a fever of 101.5 at 1209, zosyn and ancef given. Patient progressed to complete shortly after.   Delivery Note Called to room and patient was complete and pushing. Head delivered OA and restituted to ROA. Nuchal cord and compound hand present. Posterior arm delivered and cord easily reduced at time of delivery. Shoulder and body delivered in usual fashion.  At 1221 a viable and healthy female was delivered via Vaginal, Spontaneous (Presentation:OA; ROA ).  Infant with spontaneous cry, placed on mother's abdomen, dried and stimulated. Cord clamped x 2 after 1-minute delay, and cut by nurse. Cord blood drawn. Placenta delivered spontaneously with gentle cord traction. Appears intact. Fundus firm with massage and Pitocin. Labia, perineum, vagina, and cervix inspected with 2nd degree laceration, repaired.   APGAR: 8,9 ; weight 7lb 7.2 oz.  Cord: 3VC, membranes intact.   Anesthesia: Epidural Episiotomy: None Lacerations: 2nd degree perineal laceration, repaired Suture Repair: 3.0 vicryl Est. Blood Loss (mL): 200  Mom to postpartum.  Baby to Couplet care / Skin to Skin.  Charlton Haws, MD PGY-1 10/28/2020 1:14 PM    I was present and gloved for the entirety of the delivery. I agree with the findings and the plan of care as documented in the resident's note.  Sharene Skeans, MD [facility] Family Medicine Fellow, [facility] for [facility], [place]

## 2020-01-29 ENCOUNTER — Ambulatory Visit: Payer: BLUE CROSS/BLUE SHIELD | Admitting: Family Medicine

## 2020-03-22 ENCOUNTER — Ambulatory Visit (INDEPENDENT_AMBULATORY_CARE_PROVIDER_SITE_OTHER): Payer: No Typology Code available for payment source | Admitting: Obstetrics & Gynecology

## 2020-03-22 ENCOUNTER — Encounter: Payer: Self-pay | Admitting: Obstetrics & Gynecology

## 2020-03-22 ENCOUNTER — Other Ambulatory Visit: Payer: Self-pay

## 2020-03-22 VITALS — BP 107/72 | HR 109 | Wt 107.6 lb

## 2020-03-22 DIAGNOSIS — O99011 Anemia complicating pregnancy, first trimester: Secondary | ICD-10-CM

## 2020-03-22 DIAGNOSIS — O09219 Supervision of pregnancy with history of pre-term labor, unspecified trimester: Secondary | ICD-10-CM

## 2020-03-22 DIAGNOSIS — D509 Iron deficiency anemia, unspecified: Secondary | ICD-10-CM

## 2020-03-22 DIAGNOSIS — O0990 Supervision of high risk pregnancy, unspecified, unspecified trimester: Secondary | ICD-10-CM

## 2020-03-22 DIAGNOSIS — Z8759 Personal history of other complications of pregnancy, childbirth and the puerperium: Secondary | ICD-10-CM

## 2020-03-22 DIAGNOSIS — O3680X0 Pregnancy with inconclusive fetal viability, not applicable or unspecified: Secondary | ICD-10-CM

## 2020-03-22 LAB — POCT PREGNANCY, URINE: Preg Test, Ur: POSITIVE — AB

## 2020-03-22 NOTE — Progress Notes (Signed)
OFFICE VISIT NOTE  History:   Candice Ward is a 34 y.o. G2P0101 at 66w1dhere today for discussion of concerning issues during her last pregnancy.  This is a spontaneously conceived pregnancy. Had complicated delivery at 28 weeks after placental abruption during last pregnancy.  Was seen in 11/09/2019 by MFM (Dr. FAnnamaria Boots, who gave a detailed consult and instructions about subsequent pregnancies.  She is currently taking prenatal vitamins with folic acid, no acute concerns since she found out she was pregnant. She and her husband (who was on FaceTime during encounter) are concerned about what happened in last pregnancy. Has received one dose of COVID vaccine, not planning to complete regimen.  Has iron deficiency anemia (CareEverywhere showed [facility] 6[facility] and she was scheduled for IV iron infusions which she cancelled due to pregnancy.  She denies any abnormal vaginal discharge, bleeding, pelvic pain or other concerns.      OB History  Gravida Para Term Preterm AB Living  2 1 0 1 0 1  SAB TAB Ectopic Multiple Live Births  0 0 0 0 1    # Outcome Date GA Lbr Len/2nd Weight Sex Delivery Anes PTL Lv  2 Current           1 Preterm 2017 [id number]  Vag-Spont   LIV     Complications: Abruptio Placenta    Past Medical History:  Diagnosis Date  . Anemia   . Celiac disease     Past Surgical History:  Procedure Laterality Date  . BREAST DUCTAL SYSTEM EXCISION Left 10/05/2014   Procedure: LEFT NIPPLE DUCT EXCISION;  Surgeon: MaDonnie MesaMD;  [facility] [facility]: General;  Laterality: Left;  . LASIK  2009    The following portions of the patient's history were reviewed and updated as appropriate: allergies, current medications, past family history, past medical history, past social history, past surgical history and problem list.    Review of Systems:  Pertinent items noted in HPI and remainder of comprehensive ROS otherwise negative.  Physical Exam:  BP 107/72    Pulse (!) 109   Wt 107 lb 9.6 oz (48.8 kg)   LMP 02/15/2020 (Exact Date)   BMI 19.06 kg/m  CONSTITUTIONAL: Well-developed, well-nourished female in no acute distress.  HEENT:  Normocephalic, atraumatic. External right and left ear normal. No scleral icterus.  NECK: Normal range of motion, supple, no masses noted on observation SKIN: No rash noted. Not diaphoretic. No erythema. No pallor. MUSCULOSKELETAL: Normal range of motion. No edema noted. NEUROLOGIC: Alert and oriented to person, place, and time. Normal muscle tone coordination. No cranial nerve deficit noted. PSYCHIATRIC: Normal mood and affect. Normal behavior. Normal judgment and thought content. CARDIOVASCULAR: Normal heart rate noted RESPIRATORY: Effort and breath sounds normal, no problems with respiration noted ABDOMEN: No masses noted. No other overt distention noted.   PELVIC: Deferred  Labs and Imaging Results for orders placed or performed in visit on 03/22/20 (from the past 168 hour(s))  Pregnancy, urine POC   Collection Time: 03/22/20 10:34 AM  Result Value Ref Range   Preg Test, Ur POSITIVE (A) NEGATIVE   No results found.    Assessment and Plan:      1. History of placental abruption 2. Previous preterm delivery, antepartum Will follow Dr. FaAnnamaria Bootsnstructions, the following is copied and pasted from his note.  Recommendations: A thrombophilia and antiphospholipid antibody work-up is probably not indicated Once she conceives, she should be referred for: -  A first trimester ultrasound to assess the nuchal translucency and placental location -  Biweekly transvaginal ultrasounds at 16-24 weeks for assessment of cervical length  - Fetal anatomy scan at around 19 weeks -  Monthly growth ultrasounds - Weekly nonstress test for fetal assessment and detection of contractions starting at 30 weeks - Consider taking a daily baby aspirin for the duration of her pregnancy - Consider weekly 17-P injections starting at 16  weeks and continued until 36 weeks  3. Maternal iron deficiency anemia affecting pregnancy in first trimester, antepartum Ordered for Feraheme x 2 doses. Discussed this was okay to do in pregnancy. - ferumoxytol (FERAHEME) 510 mg in sodium chloride 0.9 % 100 mL IVPB  4. Encounter to determine fetal viability of pregnancy, single or unspecified fetus She is at [redacted]w[redacted]d will do viability scan then return for NOB around 9 weeks.  - [place] LESS THAN 14 WEEKS W/ OB TRANSVAGINAL AND DOPPLER; Future  5. Supervision of high risk pregnancy, antepartum She desires NIPS, will be done next visit. All questions/concerns addressed. The nature of [place] and other Advanced Practice Providers was explained to patient; also emphasized that residents, students are part of our team. Routine preventative health maintenance measures emphasized. Please refer to After Visit Summary for other counseling recommendations.   Return in about 4 weeks (around 04/19/2020) for OFFICE Initial  HOB Visit.    Total face-to-face time with patient: 20 minutes.  Over 50% of encounter was spent on counseling and coordination of care.   UVerita Schneiders MD, [place] [facility] [place]

## 2020-03-22 NOTE — Progress Notes (Signed)
Feraheme appt scheduled for 03/29/20 at 1000; pt called, pt's husband answered the call. Appt time and location given.  Apolonio Schneiders RN 03/22/20

## 2020-03-22 NOTE — Patient Instructions (Signed)
First Trimester of Pregnancy The first trimester of pregnancy is from week 1 until the end of week 13 (months 1 through 3). A week after a sperm fertilizes an egg, the egg will implant on the wall of the uterus. This embryo will begin to develop into a baby. Genes from you and your partner will form the baby. The female genes will determine whether the baby will be a boy or a girl. At 6-8 weeks, the eyes and face will be formed, and the heartbeat can be seen on ultrasound. At the end of 12 weeks, all the baby's organs will be formed. Now that you are pregnant, you will want to do everything you can to have a healthy baby. Two of the most important things are to get good prenatal care and to follow your health care provider's instructions. Prenatal care is all the medical care you receive before the baby's birth. This care will help prevent, find, and treat any problems during the pregnancy and childbirth. Body changes during your first trimester Your body goes through many changes during pregnancy. The changes vary from woman to woman.  You may gain or lose a couple of pounds at first.  You may feel sick to your stomach (nauseous) and you may throw up (vomit). If the vomiting is uncontrollable, call your health care provider.  You may tire easily.  You may develop headaches that can be relieved by medicines. All medicines should be approved by your health care provider.  You may urinate more often. Painful urination may mean you have a bladder infection.  You may develop heartburn as a result of your pregnancy.  You may develop constipation because certain hormones are causing the muscles that push stool through your intestines to slow down.  You may develop hemorrhoids or swollen veins (varicose veins).  Your breasts may begin to grow larger and become tender. Your nipples may stick out more, and the tissue that surrounds them (areola) may become darker.  Your gums may bleed and may be  sensitive to brushing and flossing.  Dark spots or blotches (chloasma, mask of pregnancy) may develop on your face. This will likely fade after the baby is born.  Your menstrual periods will stop.  You may have a loss of appetite.  You may develop cravings for certain kinds of food.  You may have changes in your emotions from day to day, such as being excited to be pregnant or being concerned that something may go wrong with the pregnancy and baby.  You may have more vivid and strange dreams.  You may have changes in your hair. These can include thickening of your hair, rapid growth, and changes in texture. Some women also have hair loss during or after pregnancy, or hair that feels dry or thin. Your hair will most likely return to normal after your baby is born. What to expect at prenatal visits During a routine prenatal visit:  You will be weighed to make sure you and the baby are growing normally.  Your blood pressure will be taken.  Your abdomen will be measured to track your baby's growth.  The fetal heartbeat will be listened to between weeks 10 and 14 of your pregnancy.  Test results from any previous visits will be discussed. Your health care provider may ask you:  How you are feeling.  If you are feeling the baby move.  If you have had any abnormal symptoms, such as leaking fluid, bleeding, severe headaches, or abdominal  cramping.  If you are using any tobacco products, including cigarettes, chewing tobacco, and electronic cigarettes.  If you have any questions. Other tests that may be performed during your first trimester include:  Blood tests to find your blood type and to check for the presence of any previous infections. The tests will also be used to check for low iron levels (anemia) and protein on red blood cells (Rh antibodies). Depending on your risk factors, or if you previously had diabetes during pregnancy, you may have tests to check for high blood sugar  that affects pregnant women (gestational diabetes).  Urine tests to check for infections, diabetes, or protein in the urine.  An ultrasound to confirm the proper growth and development of the baby.  Fetal screens for spinal cord problems (spina bifida) and Down syndrome.  HIV (human immunodeficiency virus) testing. Routine prenatal testing includes screening for HIV, unless you choose not to have this test.  You may need other tests to make sure you and the baby are doing well. Follow these instructions at home: Medicines  Follow your health care provider's instructions regarding medicine use. Specific medicines may be either safe or unsafe to take during pregnancy.  Take a prenatal vitamin that contains at least 600 micrograms (mcg) of folic acid.  If you develop constipation, try taking a stool softener if your health care provider approves. Eating and drinking   Eat a balanced diet that includes fresh fruits and vegetables, whole grains, good sources of protein such as meat, eggs, or tofu, and low-fat dairy. Your health care provider will help you determine the amount of weight gain that is right for you.  Avoid raw meat and uncooked cheese. These carry germs that can cause birth defects in the baby.  Eating four or five small meals rather than three large meals a day may help relieve nausea and vomiting. If you start to feel nauseous, eating a few soda crackers can be helpful. Drinking liquids between meals, instead of during meals, also seems to help ease nausea and vomiting.  Limit foods that are high in fat and processed sugars, such as fried and sweet foods.  To prevent constipation: ? Eat foods that are high in fiber, such as fresh fruits and vegetables, whole grains, and beans. ? Drink enough fluid to keep your urine clear or pale yellow. Activity  Exercise only as directed by your health care provider. Most women can continue their usual exercise routine during  pregnancy. Try to exercise for 30 minutes at least 5 days a week. Exercising will help you: ? Control your weight. ? Stay in shape. ? Be prepared for labor and delivery.  Experiencing pain or cramping in the lower abdomen or lower back is a good sign that you should stop exercising. Check with your health care provider before continuing with normal exercises.  Try to avoid standing for long periods of time. Move your legs often if you must stand in one place for a long time.  Avoid heavy lifting.  Wear low-heeled shoes and practice good posture.  You may continue to have sex unless your health care provider tells you not to. Relieving pain and discomfort  Wear a good support bra to relieve breast tenderness.  Take warm sitz baths to soothe any pain or discomfort caused by hemorrhoids. Use hemorrhoid cream if your health care provider approves.  Rest with your legs elevated if you have leg cramps or low back pain.  If you develop varicose veins in  your legs, wear support hose. Elevate your feet for 15 minutes, 3-4 times a day. Limit salt in your diet. Prenatal care  Schedule your prenatal visits by the twelfth week of pregnancy. They are usually scheduled monthly at first, then more often in the last 2 months before delivery.  Write down your questions. Take them to your prenatal visits.  Keep all your prenatal visits as told by your health care provider. This is important. Safety  Wear your seat belt at all times when driving.  Make a list of emergency phone numbers, including numbers for family, friends, the hospital, and police and fire departments. General instructions  Ask your health care provider for a referral to a local prenatal education class. Begin classes no later than the beginning of month 6 of your pregnancy.  Ask for help if you have counseling or nutritional needs during pregnancy. Your health care provider can offer advice or refer you to specialists for help  with various needs.  Do not use hot tubs, steam rooms, or saunas.  Do not douche or use tampons or scented sanitary pads.  Do not cross your legs for long periods of time.  Avoid cat litter boxes and soil used by cats. These carry germs that can cause birth defects in the baby and possibly loss of the fetus by miscarriage or stillbirth.  Avoid all smoking, herbs, alcohol, and medicines not prescribed by your health care provider. Chemicals in these products affect the formation and growth of the baby.  Do not use any products that contain nicotine or tobacco, such as cigarettes and e-cigarettes. If you need help quitting, ask your health care provider. You may receive counseling support and other resources to help you quit.  Schedule a dentist appointment. At home, brush your teeth with a soft toothbrush and be gentle when you floss. Contact a health care provider if:  You have dizziness.  You have mild pelvic cramps, pelvic pressure, or nagging pain in the abdominal area.  You have persistent nausea, vomiting, or diarrhea.  You have a bad smelling vaginal discharge.  You have pain when you urinate.  You notice increased swelling in your face, hands, legs, or ankles.  You are exposed to fifth disease or chickenpox.  You are exposed to [place] measles (rubella) and have never had it. Get help right away if:  You have a fever.  You are leaking fluid from your vagina.  You have spotting or bleeding from your vagina.  You have severe abdominal cramping or pain.  You have rapid weight gain or loss.  You vomit blood or material that looks like coffee grounds.  You develop a severe headache.  You have shortness of breath.  You have any kind of trauma, such as from a fall or a car accident. Summary  The first trimester of pregnancy is from week 1 until the end of week 13 (months 1 through 3).  Your body goes through many changes during pregnancy. The changes vary from  woman to woman.  You will have routine prenatal visits. During those visits, your health care provider will examine you, discuss any test results you may have, and talk with you about how you are feeling. This information is not intended to replace advice given to you by your health care provider. Make sure you discuss any questions you have with your health care provider. Document Revised: 11/08/2017 Document Reviewed: 11/07/2016 Elsevier Patient Education  2020 [employer].

## 2020-03-29 ENCOUNTER — Other Ambulatory Visit: Payer: Self-pay

## 2020-03-29 ENCOUNTER — Ambulatory Visit (HOSPITAL_COMMUNITY)
Admission: RE | Admit: 2020-03-29 | Discharge: 2020-03-29 | Disposition: A | Discharge status: Home / Self Care | Payer: No Typology Code available for payment source | Source: Ambulatory Visit | Attending: Obstetrics & Gynecology | Admitting: Obstetrics & Gynecology

## 2020-03-29 DIAGNOSIS — D509 Iron deficiency anemia, unspecified: Secondary | ICD-10-CM | POA: Insufficient documentation

## 2020-03-29 DIAGNOSIS — O99011 Anemia complicating pregnancy, first trimester: Secondary | ICD-10-CM | POA: Diagnosis present

## 2020-03-29 MED ORDER — SODIUM CHLORIDE 0.9 % IV SOLN
510.0000 mg | INTRAVENOUS | Status: DC
  Administered 2020-03-29: 510 mg via INTRAVENOUS
  Filled 2020-03-29: qty 17

## 2020-03-29 NOTE — Discharge Instructions (Signed)

## 2020-04-01 ENCOUNTER — Telehealth: Payer: Self-pay

## 2020-04-01 NOTE — Telephone Encounter (Signed)
[facility] care coordinator, Nira Conn, left VM requesting a call back regarding prior authorization for Fereheme infusion on 04/04/20. Office hours are M-F 9-5:30. Phone number is [phone], ext: [phone].

## 2020-04-04 ENCOUNTER — Encounter (HOSPITAL_COMMUNITY)
Admission: RE | Admit: 2020-04-04 | Discharge: 2020-04-04 | Disposition: A | Discharge status: Home / Self Care | Payer: No Typology Code available for payment source | Source: Ambulatory Visit | Attending: Obstetrics & Gynecology | Admitting: Obstetrics & Gynecology

## 2020-04-04 DIAGNOSIS — O99011 Anemia complicating pregnancy, first trimester: Secondary | ICD-10-CM | POA: Diagnosis not present

## 2020-04-04 DIAGNOSIS — D509 Iron deficiency anemia, unspecified: Secondary | ICD-10-CM

## 2020-04-04 MED ORDER — SODIUM CHLORIDE 0.9 % IV SOLN
510.0000 mg | INTRAVENOUS | Status: DC
  Administered 2020-04-04: 510 mg via INTRAVENOUS
  Filled 2020-04-04: qty 17

## 2020-04-05 ENCOUNTER — Encounter (HOSPITAL_COMMUNITY): Payer: No Typology Code available for payment source

## 2020-04-05 ENCOUNTER — Other Ambulatory Visit: Payer: Self-pay

## 2020-04-05 ENCOUNTER — Other Ambulatory Visit: Payer: Self-pay | Admitting: Obstetrics & Gynecology

## 2020-04-05 ENCOUNTER — Ambulatory Visit (HOSPITAL_COMMUNITY)
Admission: RE | Admit: 2020-04-05 | Discharge: 2020-04-05 | Disposition: A | Discharge status: Home / Self Care | Payer: No Typology Code available for payment source | Source: Ambulatory Visit | Attending: Obstetrics & Gynecology | Admitting: Obstetrics & Gynecology

## 2020-04-05 DIAGNOSIS — O3680X0 Pregnancy with inconclusive fetal viability, not applicable or unspecified: Secondary | ICD-10-CM | POA: Insufficient documentation

## 2020-04-12 ENCOUNTER — Encounter: Payer: Self-pay | Admitting: *Deleted

## 2020-04-21 ENCOUNTER — Ambulatory Visit (INDEPENDENT_AMBULATORY_CARE_PROVIDER_SITE_OTHER): Payer: No Typology Code available for payment source | Admitting: Obstetrics & Gynecology

## 2020-04-21 ENCOUNTER — Other Ambulatory Visit: Payer: Self-pay

## 2020-04-21 ENCOUNTER — Encounter: Payer: Self-pay | Admitting: Obstetrics & Gynecology

## 2020-04-21 VITALS — BP 100/64 | HR 87 | Wt 105.9 lb

## 2020-04-21 DIAGNOSIS — O0990 Supervision of high risk pregnancy, unspecified, unspecified trimester: Secondary | ICD-10-CM

## 2020-04-21 DIAGNOSIS — Z8759 Personal history of other complications of pregnancy, childbirth and the puerperium: Secondary | ICD-10-CM

## 2020-04-21 DIAGNOSIS — O99281 Endocrine, nutritional and metabolic diseases complicating pregnancy, first trimester: Secondary | ICD-10-CM

## 2020-04-21 DIAGNOSIS — E559 Vitamin D deficiency, unspecified: Secondary | ICD-10-CM

## 2020-04-21 DIAGNOSIS — D509 Iron deficiency anemia, unspecified: Secondary | ICD-10-CM

## 2020-04-21 DIAGNOSIS — O09219 Supervision of pregnancy with history of pre-term labor, unspecified trimester: Secondary | ICD-10-CM

## 2020-04-21 DIAGNOSIS — O09211 Supervision of pregnancy with history of pre-term labor, first trimester: Secondary | ICD-10-CM

## 2020-04-21 DIAGNOSIS — Z3A09 9 weeks gestation of pregnancy: Secondary | ICD-10-CM

## 2020-04-21 DIAGNOSIS — O99011 Anemia complicating pregnancy, first trimester: Secondary | ICD-10-CM

## 2020-04-21 DIAGNOSIS — O09291 Supervision of pregnancy with other poor reproductive or obstetric history, first trimester: Secondary | ICD-10-CM

## 2020-04-21 LAB — POCT URINALYSIS DIP (DEVICE)
Bilirubin Urine: NEGATIVE
Glucose, UA: NEGATIVE mg/dL
Hgb urine dipstick: NEGATIVE
Ketones, ur: NEGATIVE mg/dL
Nitrite: NEGATIVE
Protein, ur: NEGATIVE mg/dL
Specific Gravity, Urine: >=1.03 (ref 1.005–1.030)
Urobilinogen, UA: 0.2 mg/dL (ref 0.0–1.0)
pH: 5.5 (ref 5.0–8.0)

## 2020-04-21 MED ORDER — VITAMIN D (ERGOCALCIFEROL) 1.25 MG (50000 UNIT) PO CAPS: 50000.0000 [IU] | ORAL_CAPSULE | ORAL | 2 refills | Status: DC

## 2020-04-21 NOTE — Progress Notes (Addendum)
Ordered Pts Makena thru [employer] [phone], spoke with Savanah @ 11:13a on 04/21/20. Gave pts insurance info and address, she states will call pt directly.

## 2020-04-21 NOTE — Patient Instructions (Signed)
First Trimester of Pregnancy The first trimester of pregnancy is from week 1 until the end of week 13 (months 1 through 3). A week after a sperm fertilizes an egg, the egg will implant on the wall of the uterus. This embryo will begin to develop into a baby. Genes from you and your partner will form the baby. The female genes will determine whether the baby will be a boy or a girl. At 6-8 weeks, the eyes and face will be formed, and the heartbeat can be seen on ultrasound. At the end of 12 weeks, all the baby's organs will be formed. Now that you are pregnant, you will want to do everything you can to have a healthy baby. Two of the most important things are to get good prenatal care and to follow your health care provider's instructions. Prenatal care is all the medical care you receive before the baby's birth. This care will help prevent, find, and treat any problems during the pregnancy and childbirth. Body changes during your first trimester Your body goes through many changes during pregnancy. The changes vary from woman to woman.  You may gain or lose a couple of pounds at first.  You may feel sick to your stomach (nauseous) and you may throw up (vomit). If the vomiting is uncontrollable, call your health care provider.  You may tire easily.  You may develop headaches that can be relieved by medicines. All medicines should be approved by your health care provider.  You may urinate more often. Painful urination may mean you have a bladder infection.  You may develop heartburn as a result of your pregnancy.  You may develop constipation because certain hormones are causing the muscles that push stool through your intestines to slow down.  You may develop hemorrhoids or swollen veins (varicose veins).  Your breasts may begin to grow larger and become tender. Your nipples may stick out more, and the tissue that surrounds them (areola) may become darker.  Your gums may bleed and may be  sensitive to brushing and flossing.  Dark spots or blotches (chloasma, mask of pregnancy) may develop on your face. This will likely fade after the baby is born.  Your menstrual periods will stop.  You may have a loss of appetite.  You may develop cravings for certain kinds of food.  You may have changes in your emotions from day to day, such as being excited to be pregnant or being concerned that something may go wrong with the pregnancy and baby.  You may have more vivid and strange dreams.  You may have changes in your hair. These can include thickening of your hair, rapid growth, and changes in texture. Some women also have hair loss during or after pregnancy, or hair that feels dry or thin. Your hair will most likely return to normal after your baby is born. What to expect at prenatal visits During a routine prenatal visit:  You will be weighed to make sure you and the baby are growing normally.  Your blood pressure will be taken.  Your abdomen will be measured to track your baby's growth.  The fetal heartbeat will be listened to between weeks 10 and 14 of your pregnancy.  Test results from any previous visits will be discussed. Your health care provider may ask you:  How you are feeling.  If you are feeling the baby move.  If you have had any abnormal symptoms, such as leaking fluid, bleeding, severe headaches, or abdominal  cramping.  If you are using any tobacco products, including cigarettes, chewing tobacco, and electronic cigarettes.  If you have any questions. Other tests that may be performed during your first trimester include:  Blood tests to find your blood type and to check for the presence of any previous infections. The tests will also be used to check for low iron levels (anemia) and protein on red blood cells (Rh antibodies). Depending on your risk factors, or if you previously had diabetes during pregnancy, you may have tests to check for high blood sugar  that affects pregnant women (gestational diabetes).  Urine tests to check for infections, diabetes, or protein in the urine.  An ultrasound to confirm the proper growth and development of the baby.  Fetal screens for spinal cord problems (spina bifida) and Down syndrome.  HIV (human immunodeficiency virus) testing. Routine prenatal testing includes screening for HIV, unless you choose not to have this test.  You may need other tests to make sure you and the baby are doing well. Follow these instructions at home: Medicines  Follow your health care provider's instructions regarding medicine use. Specific medicines may be either safe or unsafe to take during pregnancy.  Take a prenatal vitamin that contains at least 600 micrograms (mcg) of folic acid.  If you develop constipation, try taking a stool softener if your health care provider approves. Eating and drinking   Eat a balanced diet that includes fresh fruits and vegetables, whole grains, good sources of protein such as meat, eggs, or tofu, and low-fat dairy. Your health care provider will help you determine the amount of weight gain that is right for you.  Avoid raw meat and uncooked cheese. These carry germs that can cause birth defects in the baby.  Eating four or five small meals rather than three large meals a day may help relieve nausea and vomiting. If you start to feel nauseous, eating a few soda crackers can be helpful. Drinking liquids between meals, instead of during meals, also seems to help ease nausea and vomiting.  Limit foods that are high in fat and processed sugars, such as fried and sweet foods.  To prevent constipation: ? Eat foods that are high in fiber, such as fresh fruits and vegetables, whole grains, and beans. ? Drink enough fluid to keep your urine clear or pale yellow. Activity  Exercise only as directed by your health care provider. Most women can continue their usual exercise routine during  pregnancy. Try to exercise for 30 minutes at least 5 days a week. Exercising will help you: ? Control your weight. ? Stay in shape. ? Be prepared for labor and delivery.  Experiencing pain or cramping in the lower abdomen or lower back is a good sign that you should stop exercising. Check with your health care provider before continuing with normal exercises.  Try to avoid standing for long periods of time. Move your legs often if you must stand in one place for a long time.  Avoid heavy lifting.  Wear low-heeled shoes and practice good posture.  You may continue to have sex unless your health care provider tells you not to. Relieving pain and discomfort  Wear a good support bra to relieve breast tenderness.  Take warm sitz baths to soothe any pain or discomfort caused by hemorrhoids. Use hemorrhoid cream if your health care provider approves.  Rest with your legs elevated if you have leg cramps or low back pain.  If you develop varicose veins in  your legs, wear support hose. Elevate your feet for 15 minutes, 3-4 times a day. Limit salt in your diet. Prenatal care  Schedule your prenatal visits by the twelfth week of pregnancy. They are usually scheduled monthly at first, then more often in the last 2 months before delivery.  Write down your questions. Take them to your prenatal visits.  Keep all your prenatal visits as told by your health care provider. This is important. Safety  Wear your seat belt at all times when driving.  Make a list of emergency phone numbers, including numbers for family, friends, the hospital, and police and fire departments. General instructions  Ask your health care provider for a referral to a local prenatal education class. Begin classes no later than the beginning of month 6 of your pregnancy.  Ask for help if you have counseling or nutritional needs during pregnancy. Your health care provider can offer advice or refer you to specialists for help  with various needs.  Do not use hot tubs, steam rooms, or saunas.  Do not douche or use tampons or scented sanitary pads.  Do not cross your legs for long periods of time.  Avoid cat litter boxes and soil used by cats. These carry germs that can cause birth defects in the baby and possibly loss of the fetus by miscarriage or stillbirth.  Avoid all smoking, herbs, alcohol, and medicines not prescribed by your health care provider. Chemicals in these products affect the formation and growth of the baby.  Do not use any products that contain nicotine or tobacco, such as cigarettes and e-cigarettes. If you need help quitting, ask your health care provider. You may receive counseling support and other resources to help you quit.  Schedule a dentist appointment. At home, brush your teeth with a soft toothbrush and be gentle when you floss. Contact a health care provider if:  You have dizziness.  You have mild pelvic cramps, pelvic pressure, or nagging pain in the abdominal area.  You have persistent nausea, vomiting, or diarrhea.  You have a bad smelling vaginal discharge.  You have pain when you urinate.  You notice increased swelling in your face, hands, legs, or ankles.  You are exposed to fifth disease or chickenpox.  You are exposed to [place] measles (rubella) and have never had it. Get help right away if:  You have a fever.  You are leaking fluid from your vagina.  You have spotting or bleeding from your vagina.  You have severe abdominal cramping or pain.  You have rapid weight gain or loss.  You vomit blood or material that looks like coffee grounds.  You develop a severe headache.  You have shortness of breath.  You have any kind of trauma, such as from a fall or a car accident. Summary  The first trimester of pregnancy is from week 1 until the end of week 13 (months 1 through 3).  Your body goes through many changes during pregnancy. The changes vary from  woman to woman.  You will have routine prenatal visits. During those visits, your health care provider will examine you, discuss any test results you may have, and talk with you about how you are feeling. This information is not intended to replace advice given to you by your health care provider. Make sure you discuss any questions you have with your health care provider. Document Revised: 11/08/2017 Document Reviewed: 11/07/2016 Elsevier Patient Education  2020 [employer].

## 2020-04-21 NOTE — Progress Notes (Addendum)
History:   Candice Ward is a 34 y.o. G2P0101 at 8w3dby LMP, early ultrasound being seen today for her first obstetrical visit.  Her obstetrical history is significant for preterm delivery and placental abruption, delivered vaginally at 28 weeks.  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 Was seen on 03/22/20, please refer to that note for further details. Patient does intend to breast feed. Pregnancy history fully reviewed.  Patient reports decreased appetite, no other significant symptoms.      HISTORY: OB History  Gravida Para Term Preterm AB Living  2 1 0 1 0 1  SAB TAB Ectopic Multiple Live Births  0 0 0 0 1    # Outcome Date GA Lbr Len/2nd Weight Sex Delivery Anes PTL Lv  2 Current           1 Preterm 2017 263w0d  Vag-Spont   LIV     Complications: Abruptio Placenta  She cannot recall last pap smear  Past Medical History:  Diagnosis Date  . Anemia   . Celiac disease   . Papilloma of left breast 01/22/2017   Excision 2015   Past Surgical History:  Procedure Laterality Date  . BREAST DUCTAL SYSTEM EXCISION Left 10/05/2014   Procedure: LEFT NIPPLE DUCT EXCISION;  Surgeon: Candice MesaMD;  [place] [facility] [facility];  Laterality: Left;  . LASIK  2009   History reviewed. No pertinent family history. Social History   Tobacco Use  . Smoking status: Never Smoker  . Smokeless tobacco: Never Used  Substance Use Topics  . Alcohol use: No  . Drug use: No   Allergies  Allergen Reactions  . Amoxicillin Other (See Comments)    Childhood allergy, suffocating feeling, rash also    Current Outpatient Medications on File Prior to Visit  Medication Sig Dispense Refill  . Prenatal Vit-Fe Fumarate-FA (MULTIVITAMIN-PRENATAL) 27-0.8 MG TABS tablet Take 1 tablet by mouth daily at 12 noon.     No current facility-administered medications on file prior to visit.    Review of Systems Pertinent items noted in HPI and remainder of  comprehensive ROS otherwise negative. Physical Exam:   Vitals:   04/21/20 0838  BP: 100/64  Pulse: 87  Weight: 105 lb 14.4 oz (48 kg)     System: General: well-developed, well-nourished female in no acute distress   Breasts:  normal appearance, no masses or tenderness bilaterally   Skin: normal coloration and turgor, no rashes   Neurologic: oriented, normal, negative, normal mood   Extremities: normal strength, tone, and muscle mass, ROM of all joints is normal   HEENT PERRLA, extraocular movement intact and sclera clear, anicteric   Mouth/Teeth mucous membranes moist, pharynx normal without lesions and dental hygiene good   Neck supple and no masses   Cardiovascular: regular rate and rhythm   Respiratory:  no respiratory distress, normal breath sounds   Abdomen: soft, non-tender; bowel sounds normal; no masses,  no organomegaly   Pelvic: Deferred by patient  Bedside Ultrasound for FHR check: 150 bpm Patient informed that the ultrasound is considered a limited obstetric ultrasound and is not intended to be a complete ultrasound exam.  Patient also informed that the ultrasound is not being completed with the intent of assessing for fetal or placental anomalies or any pelvic abnormalities.  Explained that the purpose of today's ultrasound is to assess for fetal heart rate.  Patient acknowledges the purpose of the exam and the limitations of  the study.     Assessment:    Pregnancy: G2P0101 Patient Active Problem List   Diagnosis Date Noted  . Vitamin D deficiency 04/21/2020  . Supervision of high risk pregnancy, antepartum 03/22/2020  . History of placental abruption 03/22/2020  . Previous preterm delivery, antepartum 03/22/2020  . Iron deficiency anemia 01/19/2020     Plan:    1. Previous preterm delivery, antepartum 2. History of placental abruption - [place] MFM OB TRANSVAGINAL; Future Will follow Dr. Annamaria Boots instructions, the following is copied and pasted from his note.    Recommendations: A thrombophilia and antiphospholipid antibody work-up is probably not indicated Once she conceives, she should be referred for: -A first trimester ultrasound to assess the nuchal translucency and placental location (ordered today) -Biweekly transvaginal ultrasounds at 16-24 weeks for assessment of cervical length (ordered today) - Fetal anatomy scan at around 19 weeks (ordered today) - Monthly growth ultrasounds -Weekly nonstress test for fetal assessment and detection of contractions starting at 30 weeks - Consider taking a daily baby aspirin for the duration of her pregnancy (will order next visit) - Consider weekly 17-P injections starting at 16 weeks and continued until 36 weeks (patient agreed to this, this will be ordered)  3. Vitamin D deficiency Level was 15 recently at [facility], supplementation ordered. - Vitamin D, Ergocalciferol, (DRISDOL) 1.25 MG (50000 UNIT) CAPS capsule; Take 1 capsule (50,000 Units total) by mouth once a week.  Dispense: 12 capsule; Refill: 2  4. Maternal iron deficiency anemia complicating pregnancy in first trimester She has received two doses of Feraheme as previously prescribed  5. Supervision of high risk pregnancy, antepartum - [facility] AMB BABYSCRIPTS SCHEDULE OPTIMIZATION - Culture, OB Urine - [place] MFM OB DETAIL +14 WK; Future - [place] MFM Fetal Nuchal Translucency; Future - CBC/D/Plt+RPR+Rh+ABO+Rub Ab... Initial labs drawn. Continue prenatal vitamins. Genetic Screening discussed, First trimester screen and NIPS: ordered. Panorama to be done next visit.  Ultrasound discussed; fetal anatomic survey: ordered. Problem list reviewed and updated. The nature of [place] with multiple MDs and other Advanced Practice Providers was explained to patient; also emphasized that residents, students are part of our team. Routine obstetric precautions reviewed. Return in about 4 weeks (around 05/19/2020) for  OFFICE OB Visit.  Needs lab draw for Panaroma+Horizon in 2-3 weeks from now (same day as MFM Scan).[name]     [name], MD, [place] for [employer], [place]

## 2020-04-22 LAB — CBC/D/PLT+RPR+RH+ABO+RUB AB...
Antibody Screen: NEGATIVE
Basophils Absolute: 0 10*3/uL (ref 0.0–0.2)
Basos: 1 %
EOS (ABSOLUTE): 0.2 10*3/uL (ref 0.0–0.4)
Eos: 4 %
HCV Ab: <0.1 s/co ratio (ref 0.0–0.9)
HIV Screen 4th Generation wRfx: NONREACTIVE
Hematocrit: 34.7 % (ref 34.0–46.6)
Hemoglobin: 11.2 g/dL (ref 11.1–15.9)
Hepatitis B Surface Ag: NEGATIVE
Immature Grans (Abs): 0 10*3/uL (ref 0.0–0.1)
Immature Granulocytes: 0 %
Lymphocytes Absolute: 1.5 10*3/uL (ref 0.7–3.1)
Lymphs: 26 %
MCH: 27.1 pg (ref 26.6–33.0)
MCHC: 32.3 g/dL (ref 31.5–35.7)
MCV: 84 fL (ref 79–97)
Monocytes Absolute: 0.4 10*3/uL (ref 0.1–0.9)
Monocytes: 7 %
Neutrophils Absolute: 3.7 10*3/uL (ref 1.4–7.0)
Neutrophils: 62 %
Platelets: 208 10*3/uL (ref 150–450)
RBC: 4.13 x10E6/uL (ref 3.77–5.28)
RDW: 18.2 % — ABNORMAL HIGH (ref 11.7–15.4)
RPR Ser Ql: NONREACTIVE
Rh Factor: POSITIVE
Rubella Antibodies, IGG: 29 index (ref >0.99)
WBC: 5.8 10*3/uL (ref 3.4–10.8)

## 2020-04-22 LAB — HCV INTERPRETATION

## 2020-04-23 LAB — URINE CULTURE, OB REFLEX

## 2020-04-23 LAB — CULTURE, OB URINE

## 2020-05-04 ENCOUNTER — Encounter: Payer: Self-pay | Admitting: *Deleted

## 2020-05-04 DIAGNOSIS — O0990 Supervision of high risk pregnancy, unspecified, unspecified trimester: Secondary | ICD-10-CM

## 2020-05-05 ENCOUNTER — Ambulatory Visit: Payer: No Typology Code available for payment source | Admitting: *Deleted

## 2020-05-05 ENCOUNTER — Other Ambulatory Visit: Payer: Self-pay

## 2020-05-05 ENCOUNTER — Other Ambulatory Visit: Payer: Self-pay | Admitting: *Deleted

## 2020-05-05 ENCOUNTER — Other Ambulatory Visit: Payer: No Typology Code available for payment source

## 2020-05-05 ENCOUNTER — Ambulatory Visit: Payer: No Typology Code available for payment source

## 2020-05-05 ENCOUNTER — Ambulatory Visit: Payer: No Typology Code available for payment source | Attending: Obstetrics & Gynecology

## 2020-05-05 ENCOUNTER — Encounter: Payer: Self-pay | Admitting: *Deleted

## 2020-05-05 DIAGNOSIS — O0990 Supervision of high risk pregnancy, unspecified, unspecified trimester: Secondary | ICD-10-CM

## 2020-05-05 DIAGNOSIS — O09219 Supervision of pregnancy with history of pre-term labor, unspecified trimester: Secondary | ICD-10-CM | POA: Insufficient documentation

## 2020-05-05 DIAGNOSIS — Z1379 Encounter for other screening for genetic and chromosomal anomalies: Secondary | ICD-10-CM | POA: Insufficient documentation

## 2020-05-05 DIAGNOSIS — O09211 Supervision of pregnancy with history of pre-term labor, first trimester: Secondary | ICD-10-CM | POA: Diagnosis not present

## 2020-05-05 DIAGNOSIS — O09291 Supervision of pregnancy with other poor reproductive or obstetric history, first trimester: Secondary | ICD-10-CM

## 2020-05-05 DIAGNOSIS — Z3A11 11 weeks gestation of pregnancy: Secondary | ICD-10-CM

## 2020-05-05 DIAGNOSIS — Z3682 Encounter for antenatal screening for nuchal translucency: Secondary | ICD-10-CM | POA: Diagnosis not present

## 2020-05-06 ENCOUNTER — Other Ambulatory Visit: Payer: Self-pay

## 2020-05-06 ENCOUNTER — Ambulatory Visit: Payer: No Typology Code available for payment source | Attending: Obstetrics and Gynecology

## 2020-05-06 ENCOUNTER — Ambulatory Visit: Payer: No Typology Code available for payment source

## 2020-05-06 DIAGNOSIS — O09219 Supervision of pregnancy with history of pre-term labor, unspecified trimester: Secondary | ICD-10-CM

## 2020-05-06 DIAGNOSIS — O0990 Supervision of high risk pregnancy, unspecified, unspecified trimester: Secondary | ICD-10-CM

## 2020-05-10 ENCOUNTER — Telehealth: Payer: Self-pay | Admitting: Obstetrics and Gynecology

## 2020-05-10 NOTE — Telephone Encounter (Signed)
Vicente Males from [facility] is calling requesting to speak with a nurse about a RX [phone]

## 2020-05-12 ENCOUNTER — Telehealth: Payer: Self-pay

## 2020-05-12 NOTE — Telephone Encounter (Signed)
Received message from front desk: Candice Ward from [facility] would like to speak to you about this patient  So called Candice Ward at [phone] ext: [phone], no answer, so left message to call me back directly, or I will call her again.

## 2020-05-13 ENCOUNTER — Telehealth: Payer: Self-pay | Admitting: Genetic Counselor

## 2020-05-13 ENCOUNTER — Telehealth: Payer: Self-pay

## 2020-05-13 NOTE — Telephone Encounter (Signed)
With Candice Ward's permission, I called her husband to to discuss her negative noninvasive prenatal screening (NIPS)/cell free DNA (cfDNA) testing result. Specifically, Candice Ward had NIPS through the laboratory [facility]. These negative results demonstrated an expected representation of chromosome 21, 53, 23, and sex chromosome material, greatly reducing the likelihood of trisomies 42, 46, or 55 and sex chromosome aneuploidies for the pregnancy. Candice Ward's husband requested to know about the expected fetal sex, which is female.  NIPS analyzes placental (fetal) DNA in maternal circulation. NIPS is considered to be highly specific and sensitive, but is not considered to be a diagnostic test. We reviewed that this testing identifies 91-99% of pregnancies with trisomies 75, 37, and 85, as well as sex chromosome abnormalities, but does not test for all genetic conditions.Diagnostic testing via chorionic villus sampling or amniocentesis is available should they be interested in confirming this result. Candice Ward's husband confirmed that he had no questions about these results at this time.   Buelah Manis, MS, [facility] Genetic Counselor

## 2020-05-13 NOTE — Telephone Encounter (Signed)
Attempted to reach Candice Ward [phone], no answer, left VM that will retry Monday.

## 2020-05-18 ENCOUNTER — Encounter: Payer: Self-pay | Admitting: Obstetrics and Gynecology

## 2020-05-18 ENCOUNTER — Ambulatory Visit (INDEPENDENT_AMBULATORY_CARE_PROVIDER_SITE_OTHER): Payer: No Typology Code available for payment source | Admitting: Obstetrics and Gynecology

## 2020-05-18 ENCOUNTER — Other Ambulatory Visit: Payer: Self-pay

## 2020-05-18 VITALS — BP 101/64 | HR 99 | Wt 108.2 lb

## 2020-05-18 DIAGNOSIS — O99011 Anemia complicating pregnancy, first trimester: Secondary | ICD-10-CM

## 2020-05-18 DIAGNOSIS — D508 Other iron deficiency anemias: Secondary | ICD-10-CM

## 2020-05-18 DIAGNOSIS — Z3A13 13 weeks gestation of pregnancy: Secondary | ICD-10-CM

## 2020-05-18 DIAGNOSIS — O0991 Supervision of high risk pregnancy, unspecified, first trimester: Secondary | ICD-10-CM

## 2020-05-18 DIAGNOSIS — O09219 Supervision of pregnancy with history of pre-term labor, unspecified trimester: Secondary | ICD-10-CM

## 2020-05-18 DIAGNOSIS — Z8759 Personal history of other complications of pregnancy, childbirth and the puerperium: Secondary | ICD-10-CM

## 2020-05-18 DIAGNOSIS — O09211 Supervision of pregnancy with history of pre-term labor, first trimester: Secondary | ICD-10-CM

## 2020-05-18 DIAGNOSIS — O0990 Supervision of high risk pregnancy, unspecified, unspecified trimester: Secondary | ICD-10-CM

## 2020-05-18 MED ORDER — FERROUS SULFATE 325 (65 FE) MG PO TABS: 325.0000 mg | ORAL_TABLET | Freq: Two times a day (BID) | ORAL | 1 refills | Status: DC

## 2020-05-18 NOTE — Progress Notes (Signed)
   PRENATAL VISIT NOTE  Subjective:  Candice Ward is a 34 y.o. G2P0101 at 33w2dbeing seen today for ongoing prenatal care.  She is currently monitored for the following issues for this high-risk pregnancy and has Supervision of high risk pregnancy, antepartum; History of placental abruption; Previous preterm delivery, antepartum; Iron deficiency anemia; and Vitamin D deficiency on their problem list.  Patient reports some back pain at night.  Contractions: Not present. Vag. Bleeding: None.  Movement: Absent. Denies leaking of fluid.   The following portions of the patient's history were reviewed and updated as appropriate: allergies, current medications, past family history, past medical history, past social history, past surgical history and problem list.   Objective:   Vitals:   05/18/20 1014  BP: 101/64  Pulse: 99  Weight: 108 lb 3.2 oz (49.1 kg)    Fetal Status: Fetal Heart Rate (bpm): 161   Movement: Absent     General:  Alert, oriented and cooperative. Patient is in no acute distress.  Skin: Skin is warm and dry. No rash noted.   Cardiovascular: Normal heart rate noted  Respiratory: Normal respiratory effort, no problems with respiration noted  Abdomen: Soft, gravid, appropriate for gestational age.  Pain/Pressure: Present     Pelvic: Cervical exam deferred        Extremities: Normal range of motion.  Edema: None  Mental Status: Normal mood and affect. Normal behavior. Normal judgment and thought content.   Assessment and Plan:  Pregnancy: G2P0101 at 128w2d1. Supervision of high risk pregnancy, antepartum Maternity 21 pending  2. History of placental abruption  3. Previous preterm delivery, antepartum Start Makena at 16 weeks  4. Other iron deficiency anemia S/p IV iron infusions Vomits with PO iron Gave list of iron rich foods  Preterm labor symptoms and general obstetric precautions including but not limited to vaginal bleeding, contractions, leaking of fluid  and fetal movement were reviewed in detail with the patient. Please refer to After Visit Summary for other counseling recommendations.   Return in about 4 weeks (around 06/15/2020) for high OB, in person.  Future Appointments  Date Time Provider [place]/12/2019 10:45 AM [facility] NURSE [facility]2019 10:45 AM [facility]  9:00 AM [facility]UR[facility]  9:00 AM [facility] US1 [facility] WMC    KeSloan LeiterMD

## 2020-05-18 NOTE — Patient Instructions (Signed)

## 2020-05-18 NOTE — Progress Notes (Signed)
Pt reports pain in lower back at night; rates 6 or 7/10. States this pain is waking her up each morning around 5 or 6 AM. Has not tried anything for the pain.   Apolonio Schneiders RN  05/18/20

## 2020-05-19 ENCOUNTER — Other Ambulatory Visit: Payer: No Typology Code available for payment source

## 2020-05-24 ENCOUNTER — Telehealth: Payer: Self-pay

## 2020-05-24 NOTE — Telephone Encounter (Signed)
Received a call from Wells Guiles from [place] that the pt's Makena needed to be prior authorization.  Called [facility] and was informed by [facility] that she was not able to pull the patient up in their system.  Will have to wait until we get another call.   Mel Almond, RN

## 2020-05-24 NOTE — Progress Notes (Unsigned)
Ordered 17-p thru [facility] on 04/21/20,Pt to start at 16 weeks [facility] was rejected by Pt's Ins. Sent Makena App directly to Makena-04/27/20

## 2020-06-07 ENCOUNTER — Encounter: Payer: Self-pay | Admitting: *Deleted

## 2020-06-09 ENCOUNTER — Ambulatory Visit: Payer: No Typology Code available for payment source | Admitting: *Deleted

## 2020-06-09 ENCOUNTER — Other Ambulatory Visit: Payer: Self-pay

## 2020-06-09 ENCOUNTER — Other Ambulatory Visit: Payer: Self-pay | Admitting: *Deleted

## 2020-06-09 ENCOUNTER — Ambulatory Visit: Payer: No Typology Code available for payment source | Attending: Obstetrics and Gynecology

## 2020-06-09 DIAGNOSIS — O09212 Supervision of pregnancy with history of pre-term labor, second trimester: Secondary | ICD-10-CM | POA: Diagnosis not present

## 2020-06-09 DIAGNOSIS — Z3A16 16 weeks gestation of pregnancy: Secondary | ICD-10-CM

## 2020-06-09 DIAGNOSIS — O0990 Supervision of high risk pregnancy, unspecified, unspecified trimester: Secondary | ICD-10-CM | POA: Diagnosis present

## 2020-06-09 DIAGNOSIS — O09219 Supervision of pregnancy with history of pre-term labor, unspecified trimester: Secondary | ICD-10-CM | POA: Diagnosis not present

## 2020-06-09 DIAGNOSIS — O09899 Supervision of other high risk pregnancies, unspecified trimester: Secondary | ICD-10-CM

## 2020-06-09 DIAGNOSIS — Z3686 Encounter for antenatal screening for cervical length: Secondary | ICD-10-CM

## 2020-06-15 ENCOUNTER — Ambulatory Visit (INDEPENDENT_AMBULATORY_CARE_PROVIDER_SITE_OTHER): Payer: No Typology Code available for payment source | Admitting: Obstetrics & Gynecology

## 2020-06-15 ENCOUNTER — Other Ambulatory Visit: Payer: Self-pay

## 2020-06-15 VITALS — BP 101/69 | HR 86 | Wt 115.4 lb

## 2020-06-15 DIAGNOSIS — O09212 Supervision of pregnancy with history of pre-term labor, second trimester: Secondary | ICD-10-CM

## 2020-06-15 DIAGNOSIS — O0992 Supervision of high risk pregnancy, unspecified, second trimester: Secondary | ICD-10-CM

## 2020-06-15 DIAGNOSIS — O09219 Supervision of pregnancy with history of pre-term labor, unspecified trimester: Secondary | ICD-10-CM

## 2020-06-15 DIAGNOSIS — O0990 Supervision of high risk pregnancy, unspecified, unspecified trimester: Secondary | ICD-10-CM

## 2020-06-15 DIAGNOSIS — Z3A17 17 weeks gestation of pregnancy: Secondary | ICD-10-CM

## 2020-06-15 MED ORDER — ASPIRIN EC 81 MG PO TBEC: 81.0000 mg | DELAYED_RELEASE_TABLET | Freq: Every day | ORAL | 2 refills | Status: DC

## 2020-06-15 NOTE — Patient Instructions (Signed)
Return to office for any scheduled appointments. Call the office or go to the MAU at [place] at [facility] if:  You begin to have strong, frequent contractions  Your water breaks.  Sometimes it is a big gush of fluid, sometimes it is just a trickle that keeps getting your panties wet or running down your legs  You have vaginal bleeding.  It is normal to have a small amount of spotting if your cervix was checked.   Any other obstetric concerns.

## 2020-06-15 NOTE — Progress Notes (Signed)
   PRENATAL VISIT NOTE  Subjective:  Candice Ward is a 34 y.o. G2P0101 at 5w2dbeing seen today for ongoing prenatal care.  She is currently monitored for the following issues for this high-risk pregnancy and has Supervision of high risk pregnancy, antepartum; History of placental abruption; Previous preterm delivery, antepartum; Iron deficiency anemia; and Vitamin D deficiency on their problem list.  Patient reports no complaints.  Contractions: Not present. Vag. Bleeding: None.  Movement: Present. Denies leaking of fluid.   The following portions of the patient's history were reviewed and updated as appropriate: allergies, current medications, past family history, past medical history, past social history, past surgical history and problem list.   Objective:   Vitals:   06/15/20 1328  BP: 101/69  Pulse: 86  Weight: 115 lb 6.4 oz (52.3 kg)    Fetal Status: Fetal Heart Rate (bpm): 152   Movement: Present     General:  Alert, oriented and cooperative. Patient is in no acute distress.  Skin: Skin is warm and dry. No rash noted.   Cardiovascular: Normal heart rate noted  Respiratory: Normal respiratory effort, no problems with respiration noted  Abdomen: Soft, gravid, appropriate for gestational age.  Pain/Pressure: Absent     Pelvic: Cervical exam deferred        Extremities: Normal range of motion.  Edema: None  Mental Status: Normal mood and affect. Normal behavior. Normal judgment and thought content.   Assessment and Plan:  Pregnancy: G2P0101 at [redacted]w[redacted]d. Previous preterm delivery, antepartum Already on Makena, will continue weekly. Cervical length scans as per MFM. - aspirin EC 81 MG tablet; Take 1 tablet (81 mg total) by mouth daily.  Dispense: 300 tablet; Refill: 2  2. [redacted] weeks gestation of pregnancy 3. Supervision of high risk pregnancy, antepartum Low risk NIPS ([facility]), declined AFP screen. Detailed anatomy scan already scheduled.  No other complaints or concerns.   Routine obstetric precautions reviewed. Please refer to After Visit Summary for other counseling recommendations.   Return in about 4 weeks (around 07/13/2020) [facility]  Future Appointments  Date Time Provider De[place]7/15/2021  9:00 AM [facility]  9:00 AM [facility] US1 [facility]  UgVerita SchneidersMD

## 2020-06-23 ENCOUNTER — Other Ambulatory Visit: Payer: Self-pay

## 2020-06-23 ENCOUNTER — Ambulatory Visit: Payer: No Typology Code available for payment source | Admitting: *Deleted

## 2020-06-23 ENCOUNTER — Ambulatory Visit: Payer: No Typology Code available for payment source | Attending: Obstetrics & Gynecology

## 2020-06-23 ENCOUNTER — Other Ambulatory Visit: Payer: Self-pay | Admitting: *Deleted

## 2020-06-23 DIAGNOSIS — Z3686 Encounter for antenatal screening for cervical length: Secondary | ICD-10-CM | POA: Diagnosis not present

## 2020-06-23 DIAGNOSIS — O09212 Supervision of pregnancy with history of pre-term labor, second trimester: Secondary | ICD-10-CM

## 2020-06-23 DIAGNOSIS — O0990 Supervision of high risk pregnancy, unspecified, unspecified trimester: Secondary | ICD-10-CM | POA: Insufficient documentation

## 2020-06-23 DIAGNOSIS — Z3A18 18 weeks gestation of pregnancy: Secondary | ICD-10-CM | POA: Diagnosis not present

## 2020-06-23 DIAGNOSIS — Z363 Encounter for antenatal screening for malformations: Secondary | ICD-10-CM | POA: Diagnosis not present

## 2020-06-23 DIAGNOSIS — O09219 Supervision of pregnancy with history of pre-term labor, unspecified trimester: Secondary | ICD-10-CM | POA: Diagnosis present

## 2020-06-23 DIAGNOSIS — O09899 Supervision of other high risk pregnancies, unspecified trimester: Secondary | ICD-10-CM | POA: Diagnosis present

## 2020-07-07 ENCOUNTER — Other Ambulatory Visit: Payer: Self-pay

## 2020-07-07 ENCOUNTER — Ambulatory Visit: Payer: No Typology Code available for payment source | Admitting: *Deleted

## 2020-07-07 ENCOUNTER — Ambulatory Visit: Payer: No Typology Code available for payment source | Attending: Obstetrics and Gynecology

## 2020-07-07 DIAGNOSIS — O09219 Supervision of pregnancy with history of pre-term labor, unspecified trimester: Secondary | ICD-10-CM

## 2020-07-07 DIAGNOSIS — O0990 Supervision of high risk pregnancy, unspecified, unspecified trimester: Secondary | ICD-10-CM | POA: Diagnosis present

## 2020-07-07 DIAGNOSIS — Z3686 Encounter for antenatal screening for cervical length: Secondary | ICD-10-CM

## 2020-07-07 DIAGNOSIS — Z3A20 20 weeks gestation of pregnancy: Secondary | ICD-10-CM | POA: Diagnosis not present

## 2020-07-07 DIAGNOSIS — O09212 Supervision of pregnancy with history of pre-term labor, second trimester: Secondary | ICD-10-CM | POA: Diagnosis not present

## 2020-07-07 DIAGNOSIS — O09899 Supervision of other high risk pregnancies, unspecified trimester: Secondary | ICD-10-CM | POA: Insufficient documentation

## 2020-07-13 ENCOUNTER — Ambulatory Visit (INDEPENDENT_AMBULATORY_CARE_PROVIDER_SITE_OTHER): Payer: No Typology Code available for payment source | Admitting: Obstetrics & Gynecology

## 2020-07-13 ENCOUNTER — Encounter: Payer: Self-pay | Admitting: Obstetrics & Gynecology

## 2020-07-13 ENCOUNTER — Other Ambulatory Visit: Payer: Self-pay

## 2020-07-13 VITALS — BP 105/62 | HR 70 | Wt 125.4 lb

## 2020-07-13 DIAGNOSIS — D509 Iron deficiency anemia, unspecified: Secondary | ICD-10-CM

## 2020-07-13 DIAGNOSIS — O09219 Supervision of pregnancy with history of pre-term labor, unspecified trimester: Secondary | ICD-10-CM

## 2020-07-13 DIAGNOSIS — O99012 Anemia complicating pregnancy, second trimester: Secondary | ICD-10-CM

## 2020-07-13 DIAGNOSIS — Z3A21 21 weeks gestation of pregnancy: Secondary | ICD-10-CM

## 2020-07-13 DIAGNOSIS — E559 Vitamin D deficiency, unspecified: Secondary | ICD-10-CM

## 2020-07-13 DIAGNOSIS — O0990 Supervision of high risk pregnancy, unspecified, unspecified trimester: Secondary | ICD-10-CM

## 2020-07-13 NOTE — Patient Instructions (Signed)
Return to office for any scheduled appointments. Call the office or go to the MAU at [place] at [facility] if:  You begin to have strong, frequent contractions  Your water breaks.  Sometimes it is a big gush of fluid, sometimes it is just a trickle that keeps getting your panties wet or running down your legs  You have vaginal bleeding.  It is normal to have a small amount of spotting if your cervix was checked.   You do not feel your baby moving like normal.  If you do not, get something to eat and drink and lay down and focus on feeling your baby move.   If your baby is still not moving like normal, you should call the office or go to MAU.  Any other obstetric concerns.

## 2020-07-13 NOTE — Progress Notes (Signed)
PRENATAL VISIT NOTE  Subjective:  Candice Ward is a 34 y.o. G2P0101 at 80w2dbeing seen today for ongoing prenatal care.  She is currently monitored for the following issues for this high-risk pregnancy and has Supervision of high risk pregnancy, antepartum; History of placental abruption; Previous preterm delivery, antepartum; Iron deficiency anemia; and Vitamin D deficiency on their problem list.  Patient reports no complaints.  Contractions: Not present. Vag. Bleeding: None.  Movement: Present. Denies leaking of fluid.   The following portions of the patient's history were reviewed and updated as appropriate: allergies, current medications, past family history, past medical history, past social history, past surgical history and problem list.   Objective:   Vitals:   07/13/20 1402  BP: 105/62  Pulse: 70  Weight: 125 lb 6.4 oz (56.9 kg)    Fetal Status: Fetal Heart Rate (bpm): 151   Movement: Present     General:  Alert, oriented and cooperative. Patient is in no acute distress.  Skin: Skin is warm and dry. No rash noted.   Cardiovascular: Normal heart rate noted  Respiratory: Normal respiratory effort, no problems with respiration noted  Abdomen: Soft, gravid, appropriate for gestational age.  Pain/Pressure: Absent     Pelvic: Cervical exam deferred        Extremities: Normal range of motion.  Edema: None  Mental Status: Normal mood and affect. Normal behavior. Normal judgment and thought content.   Imaging: UK[place]MFM OB Transvaginal  Result Date: 07/08/2020 ----------------------------------------------------------------------  OBSTETRICS REPORT                        (Signed Final 07/08/2020 12:10 am) ---------------------------------------------------------------------- Patient Info  ID #:       [id number]                         D.O.B.:  [birth year](33 yrs)  Name:       Candice Ward                  Visit Date: 07/07/2020 02:25 pm  ---------------------------------------------------------------------- Performed By  Attending:        CSander Nephew     Ref. Address:      [address]  [place]  Performed By:     Rodrigo Ran BS      [facility]                                  Fetal [facility] at                                                              [place] for                                                              Women  Referred By:      Osborne Oman MD ---------------------------------------------------------------------- Orders  #  Description                           Code        Ordered By  1  [place] MFM OB TRANSVAGINAL                36468.0     Tama High ----------------------------------------------------------------------  #  Order #                     Accession #                Episode #  1  [id number]                   [id number]                 [id number] ---------------------------------------------------------------------- Indications  Encounter for cervical length                   Z36.86  Poor obstetric history: Previous preterm        O09.219  delivery, antepartum (@28  weeks due to  placental abruption)  [redacted] weeks gestation of pregnancy                 Z3A.20 ---------------------------------------------------------------------- Vital Signs                                                 Height:        5'3" ---------------------------------------------------------------------- Fetal Evaluation  Num Of Fetuses:          1  Fetal Heart Rate(bpm):   151  Cardiac Activity:        Observed  Presentation:            Cephalic  Placenta:                Posterior  Amniotic  Fluid  AFI FV:      Within normal limits                               Largest Pocket(cm)                              6.4 ---------------------------------------------------------------------- OB History  Gravidity:    2         Term:   0        Prem:   1  Living:       1 ---------------------------------------------------------------------- Gestational Age  LMP:           20w 3d        Date:  02/15/20                 EDD:   11/21/20  Best:          Hyacinth Meeker 3d     Det. By:  LMP  (02/15/20)          EDD:   11/21/20 ---------------------------------------------------------------------- Cervix Uterus Adnexa  Cervix  Length:            4.8  cm.  Normal appearance by transabdominal scan. ---------------------------------------------------------------------- Impression  Limited exam for cervical length measurement given prior  preterm birth history  The cervix appears normal in length without evidence of  funneling or dynamic change ---------------------------------------------------------------------- Recommendations  Follow up CL scheduled in 2 weeks. ----------------------------------------------------------------------               Sander Nephew, MD Electronically Signed Final Report   07/08/2020 12:10 am ----------------------------------------------------------------------  [place] MFM OB Transvaginal  Result Date: 06/23/2020 ----------------------------------------------------------------------  OBSTETRICS REPORT                       (Signed Final 06/23/2020 10:16 am) ---------------------------------------------------------------------- Patient Info  ID #:       [id number]                          D.O.B.:  09-22-1986 (33 yrs)  Name:       Candice Ward                  Visit Date: 06/23/2020 09:12 am ---------------------------------------------------------------------- Performed By  Attending:        Tama High MD        Ref. Address:     [place], [place]  [place]  Performed By:     Novella Rob        Location[facility]         [facility]                    RD[facility]                                     Fetal Care at                                                             [place] for                                                             Women  Referred By:      Osborne Oman MD ---------------------------------------------------------------------- Orders  #  Description                           Code        Ordered By  1  [place] MFM OB DETAIL +14 [place]               76811.01    [name]  2  [place] MFM OB TRANSVAGINAL                00370.4     [facility] ----------------------------------------------------------------------  #  Order #                     Accession #                Episode #  1  [id number]                   [id number]                 [id number]  2  [id number]                   [id number]                 [id number] ---------------------------------------------------------------------- Indications  Poor obstetric history: Previous preterm       O09.219  delivery, antepartum (@28  weeks due to  placental abruption)  Encounter for cervical length                  Z36.86  Encounter for antenatal screening for          Z36.3  malformations  [redacted] weeks gestation of pregnancy                Z3A.18 ---------------------------------------------------------------------- Vital Signs  Height:        5'3" ---------------------------------------------------------------------- Fetal Evaluation  Num Of Fetuses:         1  Cardiac Activity:       Observed  Presentation:           Cephalic  Placenta:               Posterior  P. Cord Insertion:      Visualized, central  Amniotic Fluid  AFI FV:      Within normal limits                              Largest Pocket(cm)                              4  ---------------------------------------------------------------------- Biometry  BPD:      39.9  mm     G. Age:  18w 1d         36  %    CI:         70.9   %    70 - 86                                                          FL/HC:      17.7   %    15.8 - 18  HC:       151   mm     G. Age:  18w 1d         28  %    HC/AC:      1.07        1.07 - 1.29  AC:       141   mm     G. Age:  19w 3d         80  %    FL/BPD:     66.9   %  FL:       26.7  mm     G. Age:  18w 1d         33  %    FL/AC:      18.9   %    20 - 24  CER:      19.4  mm     G. Age:  18w 6d         69  %  NFT:       4.1  mm  LV:        5.1  mm  CM:        3.8  mm  Est. FW:     256  gm      0 lb 9 oz     66  % ---------------------------------------------------------------------- OB History  Gravidity:    2         Term:   0        Prem:   1  Living:       1 ---------------------------------------------------------------------- Gestational Age  LMP:           18w 3d        Date:  02/15/20  EDD:   11/21/20  U/S Today:     18w 3d                                        EDD:   11/21/20  Best:          18w 3d     Det. By:  LMP  (02/15/20)          EDD:   11/21/20 ---------------------------------------------------------------------- Anatomy  Cranium:               Appears normal         Aortic Arch:            Appears normal  Cavum:                 Appears normal         Ductal Arch:            Appears normal  Ventricles:            Appears normal         Diaphragm:              Appears normal  Choroid Plexus:        Appears normal         Stomach:                Appears normal, left                                                                        sided  Cerebellum:            Appears normal         Abdomen:                Appears normal  Posterior Fossa:       Appears normal         Abdominal Wall:         Appears nml (cord                                                                        insert, abd wall)  Nuchal Fold:            Appears normal         Cord Vessels:           Appears normal (3                                                                        vessel cord)  Face:  Appears normal         Kidneys:                Appear normal                         (orbits and profile)  Lips:                  Appears normal         Bladder:                Appears normal  Thoracic:              Appears normal         Spine:                  Appears normal  Heart:                 Appears normal         Upper Extremities:      Appears normal                         (4CH, axis, and                         situs)  RVOT:                  Appears normal         Lower Extremities:      Appears normal  LVOT:                  Appears normal  Other:  Heels and 5th digit visualized. Fetus appears to be a female. Open          hands visualized. Nasal bone visualized. ---------------------------------------------------------------------- Cervix Uterus Adnexa  Cervix  Length:            3.8  cm.  Measured transvaginally.  Persistant LUS contraction  Uterus  No abnormality visualized.  Right Ovary  Within normal limits.  Left Ovary  Within normal limits.  Cul De Sac  No free fluid seen.  Adnexa  No abnormality visualized. ---------------------------------------------------------------------- Impression  Patient return for fetal anatomy scan and cervical length  measurement.  History of preterm delivery.  On cell-free fetal DNA screening, the risks of fetal  aneuploidies are not increased .  We performed fetal anatomy scan. No makers of  aneuploidies or fetal structural defects are seen. Fetal  biometry is consistent with her previously-established dates.  Amniotic fluid is normal and good fetal activity is seen.  Patient understands the limitations of ultrasound in detecting  fetal anomalies.  We performed a transvaginal ultrasound to evaluate the  cervical length the shortest and best cervical length  measurement was 3.8 cm, which is within  normal limits.  Care  was taken to avoid the measurement of the lower uterine  segment that was prominent with focal contraction.  We reassured the couple of the findings. ---------------------------------------------------------------------- Recommendations  -Cervical length measurement in 2 weeks.  -Fetal growth assessment and cervical length measurement  in 4 weeks. ----------------------------------------------------------------------                  Tama High, MD Electronically Signed Final Report   06/23/2020 10:16 am ----------------------------------------------------------------------  [place] MFM OB DETAIL +14 [facility]  Result Date: 06/23/2020 ----------------------------------------------------------------------  OBSTETRICS REPORT                       (  Signed Final 06/23/2020 10:16 am) ---------------------------------------------------------------------- Patient Info  ID #:       [id number]                          D.O.B.:  Aug 21, 1986 (33 yrs)  Name:       Candice Ward                  Visit Date: 06/23/2020 09:12 am ---------------------------------------------------------------------- Performed By  Attending:        Tama High MD        Ref. Address:     [place], [place]  Performed By:     Novella Rob        [facility]                                     Fetal Care at                                                             [place] for                                                             Women  Referred By:      Osborne Oman MD ---------------------------------------------------------------------- Orders  #  Description                           Code        Ordered By  1  [place] MFM OB DETAIL +14 [place]  09983.38    [name]  2  [place] MFM OB TRANSVAGINAL                25053.9     [facility] ----------------------------------------------------------------------  #  Order #                     Accession #                Episode #  1  [id number]                   [id number]                 [id number]  2  [id number]                   [id number]                 [id number] ---------------------------------------------------------------------- Indications  Poor obstetric history: Previous preterm       O09.219  delivery, antepartum (@28  weeks due to  placental abruption)  Encounter for cervical length                  Z36.86  Encounter for antenatal screening for          Z36.3  malformations  [redacted] weeks gestation of pregnancy                Z3A.18 ---------------------------------------------------------------------- Vital Signs                                                 Height:        5'3" ---------------------------------------------------------------------- Fetal Evaluation  Num Of Fetuses:         1  Cardiac Activity:       Observed  Presentation:           Cephalic  Placenta:               Posterior  P. Cord Insertion:      Visualized, central  Amniotic Fluid  AFI FV:      Within normal limits                              Largest Pocket(cm)                              4 ---------------------------------------------------------------------- Biometry  BPD:      39.9  mm     G. Age:  18w 1d         36  %    CI:         70.9   %    70 - 86                                                          FL/HC:      17.7   %    15.8 - 18  HC:       151   mm     G. Age:  18w 1d  28  %    HC/AC:      1.07        1.07 - 1.29  AC:       141   mm     G. Age:  19w 3d         80  %    FL/BPD:     66.9   %  FL:       26.7  mm     G. Age:  18w 1d         33  %    FL/AC:      18.9   %    20 - 24  CER:      19.4  mm     G. Age:  18w 6d         69  %  NFT:       4.1  mm  LV:        5.1  mm  CM:        3.8  mm  Est. FW:     256   gm      0 lb 9 oz     66  % ---------------------------------------------------------------------- OB History  Gravidity:    2         Term:   0        Prem:   1  Living:       1 ---------------------------------------------------------------------- Gestational Age  LMP:           18w 3d        Date:  02/15/20                 EDD:   11/21/20  U/S Today:     18w 3d                                        EDD:   11/21/20  Best:          18w 3d     Det. By:  LMP  (02/15/20)          EDD:   11/21/20 ---------------------------------------------------------------------- Anatomy  Cranium:               Appears normal         Aortic Arch:            Appears normal  Cavum:                 Appears normal         Ductal Arch:            Appears normal  Ventricles:            Appears normal         Diaphragm:              Appears normal  Choroid Plexus:        Appears normal         Stomach:                Appears normal, left  sided  Cerebellum:            Appears normal         Abdomen:                Appears normal  Posterior Fossa:       Appears normal         Abdominal Wall:         Appears nml (cord                                                                        insert, abd wall)  Nuchal Fold:           Appears normal         Cord Vessels:           Appears normal (3                                                                        vessel cord)  Face:                  Appears normal         Kidneys:                Appear normal                         (orbits and profile)  Lips:                  Appears normal         Bladder:                Appears normal  Thoracic:              Appears normal         Spine:                  Appears normal  Heart:                 Appears normal         Upper Extremities:      Appears normal                         (4CH, axis, and                         situs)  RVOT:                  Appears normal          Lower Extremities:      Appears normal  LVOT:                  Appears normal  Other:  Heels and 5th digit visualized. Fetus appears to be a female. Open          hands visualized.  Nasal bone visualized. ---------------------------------------------------------------------- Cervix Uterus Adnexa  Cervix  Length:            3.8  cm.  Measured transvaginally.  Persistant LUS contraction  Uterus  No abnormality visualized.  Right Ovary  Within normal limits.  Left Ovary  Within normal limits.  Cul De Sac  No free fluid seen.  Adnexa  No abnormality visualized. ---------------------------------------------------------------------- Impression  Patient return for fetal anatomy scan and cervical length  measurement.  History of preterm delivery.  On cell-free fetal DNA screening, the risks of fetal  aneuploidies are not increased .  We performed fetal anatomy scan. No makers of  aneuploidies or fetal structural defects are seen. Fetal  biometry is consistent with her previously-established dates.  Amniotic fluid is normal and good fetal activity is seen.  Patient understands the limitations of ultrasound in detecting  fetal anomalies.  We performed a transvaginal ultrasound to evaluate the  cervical length the shortest and best cervical length  measurement was 3.8 cm, which is within normal limits.  Care  was taken to avoid the measurement of the lower uterine  segment that was prominent with focal contraction.  We reassured the couple of the findings. ---------------------------------------------------------------------- Recommendations  -Cervical length measurement in 2 weeks.  -Fetal growth assessment and cervical length measurement  in 4 weeks. ----------------------------------------------------------------------                  Tama High, MD Electronically Signed Final Report   06/23/2020 10:16 am ----------------------------------------------------------------------   Assessment and Plan:  Pregnancy: G2P0101 at  65w2d1. Previous preterm delivery, antepartum Continue weekly Makena. Stable cervical length.  2. Maternal iron deficiency anemia complicating pregnancy in second trimester Will recheck levels. S/p Feraheme earlier in pregnancy.  - Ferritin - CBC  3. Vitamin D deficiency On oral repletion since 04/2020, will check levels - VITAMIN D 25 Hydroxy (Vit-D Deficiency, Fractures)  4. [redacted] weeks gestation of pregnancy 5. Supervision of high risk pregnancy, antepartum Normal anatomy scan. Preterm labor symptoms and general obstetric precautions including but not limited to vaginal bleeding, contractions, leaking of fluid and fetal movement were reviewed in detail with the patient. Please refer to After Visit Summary for other counseling recommendations.   Return in about 4 weeks (around 08/10/2020) for OFFICE OB Visit.  Future Appointments  Date Time Provider [place] 07/21/2020  1:45 [facility]  2:00 PM [facility] US1 [facility] [place]   UVerita Schneiders MD

## 2020-07-14 LAB — CBC
Hematocrit: 33.2 % — ABNORMAL LOW (ref 34.0–46.6)
Hemoglobin: 11.2 g/dL (ref 11.1–15.9)
MCH: 30.6 pg (ref 26.6–33.0)
MCHC: 33.7 g/dL (ref 31.5–35.7)
MCV: 91 fL (ref 79–97)
Platelets: 234 10*3/uL (ref 150–450)
RBC: 3.66 x10E6/uL — ABNORMAL LOW (ref 3.77–5.28)
RDW: 12.4 % (ref 11.7–15.4)
WBC: 8.3 10*3/uL (ref 3.4–10.8)

## 2020-07-14 LAB — FERRITIN: Ferritin: 87 ng/mL (ref 15–150)

## 2020-07-14 LAB — VITAMIN D 25 HYDROXY (VIT D DEFICIENCY, FRACTURES): Vit D, 25-Hydroxy: 35.2 ng/mL (ref 30.0–100.0)

## 2020-07-21 ENCOUNTER — Other Ambulatory Visit: Payer: Self-pay | Admitting: *Deleted

## 2020-07-21 ENCOUNTER — Ambulatory Visit: Payer: No Typology Code available for payment source | Admitting: *Deleted

## 2020-07-21 ENCOUNTER — Ambulatory Visit: Payer: No Typology Code available for payment source | Attending: Obstetrics and Gynecology

## 2020-07-21 ENCOUNTER — Other Ambulatory Visit: Payer: Self-pay

## 2020-07-21 DIAGNOSIS — Z3686 Encounter for antenatal screening for cervical length: Secondary | ICD-10-CM | POA: Diagnosis not present

## 2020-07-21 DIAGNOSIS — O09219 Supervision of pregnancy with history of pre-term labor, unspecified trimester: Secondary | ICD-10-CM | POA: Insufficient documentation

## 2020-07-21 DIAGNOSIS — O0990 Supervision of high risk pregnancy, unspecified, unspecified trimester: Secondary | ICD-10-CM | POA: Insufficient documentation

## 2020-07-21 DIAGNOSIS — Z3A22 22 weeks gestation of pregnancy: Secondary | ICD-10-CM | POA: Diagnosis not present

## 2020-07-21 DIAGNOSIS — Z362 Encounter for other antenatal screening follow-up: Secondary | ICD-10-CM

## 2020-07-21 DIAGNOSIS — O09212 Supervision of pregnancy with history of pre-term labor, second trimester: Secondary | ICD-10-CM

## 2020-07-21 DIAGNOSIS — O09899 Supervision of other high risk pregnancies, unspecified trimester: Secondary | ICD-10-CM

## 2020-07-23 ENCOUNTER — Emergency Department (HOSPITAL_COMMUNITY)
Admission: EM | Admit: 2020-07-23 | Discharge: 2020-07-23 | Disposition: A | Discharge status: Other Acute Inpatient Hospital | Payer: No Typology Code available for payment source

## 2020-07-23 ENCOUNTER — Other Ambulatory Visit: Payer: Self-pay

## 2020-07-23 ENCOUNTER — Inpatient Hospital Stay (HOSPITAL_COMMUNITY)
Admission: AD | Admit: 2020-07-23 | Discharge: 2020-07-23 | Disposition: A | Discharge status: Home / Self Care | Payer: No Typology Code available for payment source | Attending: Family Medicine | Admitting: Family Medicine

## 2020-07-23 ENCOUNTER — Encounter (HOSPITAL_COMMUNITY): Payer: Self-pay | Admitting: Emergency Medicine

## 2020-07-23 ENCOUNTER — Encounter (HOSPITAL_COMMUNITY): Payer: Self-pay | Admitting: Family Medicine

## 2020-07-23 DIAGNOSIS — R102 Pelvic and perineal pain: Secondary | ICD-10-CM

## 2020-07-23 DIAGNOSIS — O09212 Supervision of pregnancy with history of pre-term labor, second trimester: Secondary | ICD-10-CM

## 2020-07-23 DIAGNOSIS — O0990 Supervision of high risk pregnancy, unspecified, unspecified trimester: Secondary | ICD-10-CM

## 2020-07-23 DIAGNOSIS — Z880 Allergy status to penicillin: Secondary | ICD-10-CM | POA: Insufficient documentation

## 2020-07-23 DIAGNOSIS — O26892 Other specified pregnancy related conditions, second trimester: Secondary | ICD-10-CM

## 2020-07-23 DIAGNOSIS — O09219 Supervision of pregnancy with history of pre-term labor, unspecified trimester: Secondary | ICD-10-CM

## 2020-07-23 DIAGNOSIS — Z3A22 22 weeks gestation of pregnancy: Secondary | ICD-10-CM

## 2020-07-23 DIAGNOSIS — M549 Dorsalgia, unspecified: Secondary | ICD-10-CM | POA: Diagnosis present

## 2020-07-23 DIAGNOSIS — O26899 Other specified pregnancy related conditions, unspecified trimester: Secondary | ICD-10-CM

## 2020-07-23 LAB — URINALYSIS, ROUTINE W REFLEX MICROSCOPIC
Bilirubin Urine: NEGATIVE
Glucose, UA: NEGATIVE mg/dL
Hgb urine dipstick: NEGATIVE
Ketones, ur: NEGATIVE mg/dL
Nitrite: NEGATIVE
Protein, ur: NEGATIVE mg/dL
Specific Gravity, Urine: 1.012 (ref 1.005–1.030)
pH: 8 (ref 5.0–8.0)

## 2020-07-23 NOTE — ED Notes (Signed)
Candice Ward, [place] to assess pt. In triage

## 2020-07-23 NOTE — ED Triage Notes (Signed)
Pt. Stated, Im having pain in the left back radiating to abdomen and pressure. Started about a hour ago.[name] Pt is [redacted] weeks pregnant.

## 2020-07-23 NOTE — Discharge Instructions (Signed)
No sign of infection on your urinalysis but we will call you if your urine culture is abnormal and requires treatment (this takes several days to result).  Be sure to stay well hydrated and take tylenol every 6 hours as needed for pain.  Round Ligament Pain  The round ligament is a cord of muscle and tissue that helps support the uterus. It can become a source of pain during pregnancy if it becomes stretched or twisted as the baby grows. The pain usually begins in the second trimester (13-28 weeks) of pregnancy, and it can come and go until the baby is delivered. It is not a serious problem, and it does not cause harm to the baby. Round ligament pain is usually a short, sharp, and pinching pain, but it can also be a dull, lingering, and aching pain. The pain is felt in the lower side of the abdomen or in the groin. It usually starts deep in the groin and moves up to the outside of the hip area. The pain may occur when you:  Suddenly change position, such as quickly going from a sitting to standing position.  Roll over in bed.  Cough or sneeze.  Do physical activity. Follow these instructions at home:   Watch your condition for any changes.  When the pain starts, relax. Then try any of these methods to help with the pain: ? Sitting down. ? Flexing your knees up to your abdomen. ? Lying on your side with one pillow under your abdomen and another pillow between your legs. ? Sitting in a warm bath for 15-20 minutes or until the pain goes away.  Take over-the-counter and prescription medicines only as told by your health care provider.  Move slowly when you sit down or stand up.  Avoid long walks if they cause pain.  Stop or reduce your physical activities if they cause pain.  Keep all follow-up visits as told by your health care provider. This is important. Contact a health care provider if:  Your pain does not go away with treatment.  You feel pain in your back that you did not  have before.  Your medicine is not helping. Get help right away if:  You have a fever or chills.  You develop uterine contractions.  You have vaginal bleeding.  You have nausea or vomiting.  You have diarrhea.  You have pain when you urinate. Summary  Round ligament pain is felt in the lower abdomen or groin. It is usually a short, sharp, and pinching pain. It can also be a dull, lingering, and aching pain.  This pain usually begins in the second trimester (13-28 weeks). It occurs because the uterus is stretching with the growing baby, and it is not harmful to the baby.  You may notice the pain when you suddenly change position, when you cough or sneeze, or during physical activity.  Relaxing, flexing your knees to your abdomen, lying on one side, or taking a warm bath may help to get rid of the pain.  Get help from your health care provider if the pain does not go away or if you have vaginal bleeding, nausea, vomiting, diarrhea, or painful urination. This information is not intended to replace advice given to you by your health care provider. Make sure you discuss any questions you have with your health care provider. Document Revised: 05/14/2018 Document Reviewed: 05/14/2018 Elsevier Patient Education  [place].   Back Pain in Pregnancy Back pain during pregnancy is  common. Back pain may be caused by several factors that are related to changes during your pregnancy. Follow these instructions at home: Managing pain, stiffness, and swelling      If directed, for sudden (acute) back pain, put ice on the painful area. ? Put ice in a plastic bag. ? Place a towel between your skin and the bag. ? Leave the ice on for 20 minutes, 2-3 times per day.  If directed, apply heat to the affected area before you exercise. Use the heat source that your health care provider recommends, such as a moist heat pack or a heating pad. ? Place a towel between your skin and the heat  source. ? Leave the heat on for 20-30 minutes. ? Remove the heat if your skin turns bright red. This is especially important if you are unable to feel pain, heat, or cold. You may have a greater risk of getting burned.  If directed, massage the affected area. Activity  Exercise as told by your health care provider. Gentle exercise is the best way to prevent or manage back pain.  Listen to your body when lifting. If lifting hurts, ask for help or bend your knees. This uses your leg muscles instead of your back muscles.  Squat down when picking up something from the floor. Do not bend over.  Only use bed rest for short periods as told by your health care provider. Bed rest should only be used for the most severe episodes of back pain. Standing, sitting, and lying down  Do not stand in one place for long periods of time.  Use good posture when sitting. Make sure your head rests over your shoulders and is not hanging forward. Use a pillow on your lower back if necessary.  Try sleeping on your side, preferably the left side, with a pregnancy support pillow or 1-2 regular pillows between your legs. ? If you have back pain after a night's rest, your bed may be too soft. ? A firm mattress may provide more support for your back during pregnancy. General instructions  Do not wear high heels.  Eat a healthy diet. Try to gain weight within your health care provider's recommendations.  Use a maternity girdle, elastic sling, or back brace as told by your health care provider.  Take over-the-counter and prescription medicines only as told by your health care provider.  Work with a physical therapist or massage therapist to find ways to manage back pain. Acupuncture or massage therapy may be helpful.  Keep all follow-up visits as told by your health care provider. This is important. Contact a health care provider if:  Your back pain interferes with your daily activities.  You have increasing  pain in other parts of your body. Get help right away if:  You develop numbness, tingling, weakness, or problems with the use of your arms or legs.  You develop severe back pain that is not controlled with medicine.  You have a change in bowel or bladder control.  You develop shortness of breath, dizziness, or you faint.  You develop nausea, vomiting, or sweating.  You have back pain that is a rhythmic, cramping pain similar to labor pains. Labor pain is usually 1-2 minutes apart, lasts for about 1 minute, and involves a bearing down feeling or pressure in your pelvis.  You have back pain and your water breaks or you have vaginal bleeding.  You have back pain or numbness that travels down your leg.  Your back  pain developed after you fell.  You develop pain on one side of your back.  You see blood in your urine.  You develop skin blisters in the area of your back pain. Summary  Back pain may be caused by several factors that are related to changes during your pregnancy.  Follow instructions as told by your health care provider for managing pain, stiffness, and swelling.  Exercise as told by your health care provider. Gentle exercise is the best way to prevent or manage back pain.  Take over-the-counter and prescription medicines only as told by your health care provider.  Keep all follow-up visits as told by your health care provider. This is important. This information is not intended to replace advice given to you by your health care provider. Make sure you discuss any questions you have with your health care provider. Document Revised: 03/17/2019 Document Reviewed: 05/14/2018 Elsevier Patient Education  [place].

## 2020-07-23 NOTE — MAU Provider Note (Signed)
Chief Complaint:  Back Pain   First Provider Initiated Contact with Patient 07/23/20 1325    HPI: Candice Ward is a 34 y.o. G2P0101 at 71w5dby L/6 who presents to maternity admissions reporting concern of left lower back pain wrapping to the lower abdomen. Pt reports that her discomfort started 2-3 hours prior to arrival to MAU. She describes the pain as previously constant but now intermittent and rated 3/10. She reports that her pain is worse with walking and with defecation/urination but no dysuria. She did not take any medications prior to arrival and declines tylenol now. No medications in pregnancy except for PNV. No history of prior UTIs or kidney stones. Her next prenatal appointment is on 9/2.   She reports good fetal movement, denies LOF, vaginal bleeding, vaginal itching/burning, urinary symptoms, h/a, dizziness, n/v, or fever/chills.  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   Excision 2015    Past obstetric history: OB History  Gravida Para Term Preterm AB Living  2 1   1   1   SAB TAB Ectopic Multiple Live Births          1    # Outcome Date GA Lbr Len/2nd Weight Sex Delivery Anes PTL Lv  2 Current           1 Preterm 2017 [redacted]w[redacted]d  Vag-Spont   LIV     Complications: Abruptio Placenta    Past Surgical History: Past Surgical History:  Procedure Laterality Date   BREAST DUCTAL SYSTEM EXCISION Left 10/05/2014   Procedure: LEFT NIPPLE DUCT EXCISION;  Surgeon: MaDonnie MesaMD;  [facility]: General;  Laterality: Left;   LASIK  2009    Family History: History reviewed. No pertinent family history.  Social History: Social History   Tobacco Use   Smoking status: Never Smoker   Smokeless tobacco: Never Used  VaScientific laboratory technicianse: Never used  Substance Use Topics   Alcohol use: No   Drug use: No    Allergies:  Allergies  Allergen  Reactions   Amoxicillin Other (See Comments)    Childhood allergy, suffocating feeling, rash also     Meds:  No medications prior to admission.    ROS:  Review of Systems  Constitutional: Negative for appetite change, chills and fever.  HENT: Negative for congestion and sore throat.   Eyes: Negative for photophobia and visual disturbance.  Respiratory: Negative for cough, chest tightness and shortness of breath.   Cardiovascular: Negative for chest pain.  Gastrointestinal: Negative for abdominal pain, constipation, diarrhea, nausea and vomiting.  Endocrine: Negative for polyuria.  Genitourinary: Positive for flank pain. Negative for decreased urine volume, difficulty urinating, dyspareunia, dysuria, frequency, hematuria, pelvic pain, urgency, vaginal bleeding, vaginal discharge and vaginal pain.  Musculoskeletal: Positive for back pain.  Neurological: Negative for headaches.   I have reviewed patient's Past Medical Hx, Surgical Hx, Family Hx, Social Hx, medications and allergies.   Physical Exam   Patient Vitals for the past 24 hrs:  BP Temp Temp src Pulse Resp SpO2 Height Weight  07/23/20 1426 105/62 -- -- 85 -- 98 % -- --  07/23/20 1255 (!) 105/59 98.5 F (36.9 C) Oral 97 18 100 % -- --  07/23/20 1252 -- -- -- -- -- -- 5' 3"  (1.6 m) 57.9 kg   Constitutional: Well-developed, well-nourished female in no  acute distress. Sitting up comfortably on stretcher. Cardiovascular: normal rate and rhythm Respiratory: normal effort GI: Abd soft, non-tender, gravid appropriate for gestational age. No rebound or guarding. No CVA tenderness. Back: Full ROM. Mild tenderness to palpation along left paraspinal muscles and obliques. MS: Extremities nontender, no edema, normal ROM Neurologic: Alert and oriented x 4.  GU: deferred per pt preference   FHT: Baseline 159 on FHTs   Labs: Results for orders placed or performed during the hospital encounter of 07/23/20 (from the past 24 hour(s))   Urinalysis, Routine w reflex microscopic Urine, Clean Catch     Status: Abnormal   Collection Time: 07/23/20  1:11 PM  Result Value Ref Range   Color, Urine YELLOW YELLOW   APPearance CLEAR CLEAR   Specific Gravity, Urine 1.012 1.005 - 1.030   pH 8.0 5.0 - 8.0   Glucose, UA NEGATIVE NEGATIVE mg/dL   Hgb urine dipstick NEGATIVE NEGATIVE   Bilirubin Urine NEGATIVE NEGATIVE   Ketones, ur NEGATIVE NEGATIVE mg/dL   Protein, ur NEGATIVE NEGATIVE mg/dL   Nitrite NEGATIVE NEGATIVE   Leukocytes,Ua TRACE (A) NEGATIVE   WBC, UA 0-5 0 - 5 WBC/hpf   Bacteria, UA RARE (A) NONE SEEN   Squamous Epithelial / LPF 0-5 0 - 5   O/Positive/-- (05/13 0925)  Imaging:  [place] MFM OB Transvaginal  Result Date: 07/21/2020 ----------------------------------------------------------------------  OBSTETRICS REPORT                       (Signed Final 07/21/2020 05:06 pm) ---------------------------------------------------------------------- Patient Info  ID #:       [id number]                          D.O.B.:  1986-08-01 (33 yrs)  Name:       Candice Ward                  Visit Date: 07/21/2020 02:47 pm ---------------------------------------------------------------------- Performed By  Attending:        Tama High MD        Ref. Address:     [place], [place]  Performed By:     Rodrigo Ran BS      [facility]                                 [facility]                                                             [facility]                                                             [facility]  Referred By:      Osborne Oman MD ---------------------------------------------------------------------- Orders  #  Description                            Code        Ordered By  1  [place] MFM OB FOLLOW UP                   76816.01    RAVI SHANKAR  2  [place] MFM OB TRANSVAGINAL                65035.4     RAVI [facility] ----------------------------------------------------------------------  #  Order #                     Accession #                Episode #  1  [id number]                   [id number]                 [id number]  2  [id number]                   [id number]                 [id number] ---------------------------------------------------------------------- Indications  Encounter for cervical length                  Z36.86  Poor obstetric history: Previous preterm       O09.219  delivery, antepartum (@28  weeks due to  placental abruption)  [redacted] weeks gestation of pregnancy                Z3A.22  Encounter for other antenatal screening        Z36.2  follow-up ---------------------------------------------------------------------- Vital Signs                                                 Height:        5'3" ---------------------------------------------------------------------- Fetal Evaluation  Num Of Fetuses:         1  Fetal Heart Rate(bpm):  155  Cardiac Activity:       Observed  Presentation:  Variable  Placenta:               Posterior  P. Cord Insertion:      Previously Visualized  Amniotic Fluid  AFI FV:      Within normal limits                              Largest Pocket(cm)                              6.8 ---------------------------------------------------------------------- Biometry  BPD:      54.7  mm     G. Age:  22w 5d         56  %    CI:        72.46   %    70 - 86                                                          FL/HC:      18.5   %    18.4 - 20.2  HC:      204.4  mm     G. Age:  22w 4d         42  %    HC/AC:      1.06        1.06 - 1.25  AC:      193.3  mm     G. Age:  24w 0d         87  %    FL/BPD:     69.1   %    71 - 87  FL:       37.8  mm     G. Age:  22w 1d         28  %    FL/AC:      19.6   %    20 - 24  HUM:      37.6  mm     G.  Age:  23w 1d         65  %  Est. FW:     563  gm      1 lb 4 oz     77  % ---------------------------------------------------------------------- OB History  Gravidity:    2         Term:   0        Prem:   1  Living:       1 ---------------------------------------------------------------------- Gestational Age  LMP:           22w 3d        Date:  02/15/20                 EDD:   11/21/20  U/S Today:     22w 6d                                        EDD:   11/18/20  Best:          22w 3d     Det. By:  LMP  (02/15/20)  EDD:   11/21/20 ---------------------------------------------------------------------- Anatomy  Cranium:               Appears normal         Aortic Arch:            Previously seen  Cavum:                 Appears normal         Ductal Arch:            Previously seen  Ventricles:            Appears normal         Diaphragm:              Appears normal  Choroid Plexus:        Appears normal         Stomach:                Appears normal, left                                                                        sided  Cerebellum:            Appears normal         Abdomen:                Appears normal  Posterior Fossa:       Previously seen        Abdominal Wall:         Appears nml (cord                                                                        insert, abd wall)  Nuchal Fold:           Previously seen        Cord Vessels:           Appears normal (3                                                                        vessel cord)  Face:                  Appears normal         Kidneys:                Appear normal                         (orbits and profile)  Lips:                  Previously seen        Bladder:  Appears normal  Thoracic:              Appears normal         Spine:                  Previously seen  Heart:                 Previously seen        Upper Extremities:      Previously seen  RVOT:                  Previously seen        Lower Extremities:       Previously seen  LVOT:                  Appears normal  Other:  Heels and 5th digit previously visualized. Fetus appears to be a female.          Open hands previously visualized. Nasal bone visualized. ---------------------------------------------------------------------- Cervix Uterus Adnexa  Cervix  Length:            4.2  cm.  Normal appearance by transvaginal scan  Uterus  No abnormality visualized.  Right Ovary  Not visualized.  Left Ovary  Not visualized.  Cul De Sac  No free fluid seen.  Adnexa  No abnormality visualized. ---------------------------------------------------------------------- Impression  Patient returns for fetal growth assessment and cervical  length measurement.  Obstetric history significant for a  preterm delivery at [redacted] weeks gestation possibly from  placental abruption.  Patient had MFM consultation and a  plan was made for cervical length measurement every 2  weeks till [redacted] weeks gestation.  Fetal growth is appropriate for gestational age .Amniotic fluid  is normal and good fetal activity is seen .  We performed  transvaginal ultrasound.  The cervix measures 4.2 cm, which  is within normal limits.  We reassured the patient of the findings. ---------------------------------------------------------------------- Recommendations  -An appointment was made for her to return in 8 weeks for  fetal growth assessment (poor obstetric history). ----------------------------------------------------------------------                  Tama High, MD Electronically Signed Final Report   07/21/2020 05:06 pm ----------------------------------------------------------------------  [place] MFM OB Transvaginal  Result Date: 07/08/2020 ----------------------------------------------------------------------  OBSTETRICS REPORT                        (Signed Final 07/08/2020 12:10 am) ---------------------------------------------------------------------- Patient Info  ID #:       [id number]                           D.O.B.:  [birth year] (33 yrs)  Name:       Vana AL Ward                  Visit Date: 07/07/2020 02:25 pm ---------------------------------------------------------------------- Performed By  Attending:        Sander Nephew      Ref. Address:      [place]                    MD  [place], [place]  Performed By:     Rodrigo Ran BS      [facility]                                  [facility] at                                                              [place] for                                                              Women  Referred By:      Osborne Oman MD ---------------------------------------------------------------------- Orders  #  Description                           Code        Ordered By  1  [place] MFM OB TRANSVAGINAL                88891.6     Tama High ----------------------------------------------------------------------  #  Order #                     Accession #                Episode #  1  [id number]                   [id number]                 [id number] ---------------------------------------------------------------------- Indications  Encounter for cervical length                   Z36.86  Poor obstetric history: Previous preterm        O09.219  delivery, antepartum (@28  weeks due to  placental abruption)  [redacted] weeks gestation of pregnancy                 Z3A.20 ---------------------------------------------------------------------- Vital Signs  Height:        5'3" ---------------------------------------------------------------------- Fetal Evaluation  Num Of Fetuses:          1  Fetal Heart Rate(bpm):   151  Cardiac Activity:        Observed  Presentation:             Cephalic  Placenta:                Posterior  Amniotic Fluid  AFI FV:      Within normal limits                              Largest Pocket(cm)                              6.4 ---------------------------------------------------------------------- OB History  Gravidity:    2         Term:   0        Prem:   1  Living:       1 ---------------------------------------------------------------------- Gestational Age  LMP:           20w 3d        Date:  02/15/20                 EDD:   11/21/20  Best:          Hyacinth Me[place] 3d     Det. By:  LMP  (02/15/20)          EDD:   11/21/20 ---------------------------------------------------------------------- Cervix Uterus Adnexa  Cervix  Length:            4.8  cm.  Normal appearance by transabdominal scan. ---------------------------------------------------------------------- Impression  Limited exam for cervical length measurement given prior  preterm birth history  The cervix appears normal in length without evidence of  funneling or dynamic change ---------------------------------------------------------------------- Recommendations  Follow up CL scheduled in 2 weeks. ----------------------------------------------------------------------               Sander Nephew, MD Electronically Signed Final Report   07/08/2020 12:10 am ----------------------------------------------------------------------  [place] MFM OB FOLLOW UP  Result Date: 07/21/2020 ----------------------------------------------------------------------  OBSTETRICS REPORT                       (Signed Final 07/21/2020 05:06 pm) ---------------------------------------------------------------------- Patient Info  ID #:       [id number]                          D.O.B.:  Dec 29, 1985 (33 yrs)  Name:       Loza AL Ward                  Visit Date: 07/21/2020 02:47 pm ---------------------------------------------------------------------- Performed By  Attending:        Tama High MD        Ref. Address:     [address]  [place], [place]  Performed By:     Rodrigo Ran BS      [facility]                                 [facility] at                                                             [place] for                                                             Women  Referred By:      Osborne Oman MD ---------------------------------------------------------------------- Orders  #  Description                           Code        Ordered By  1  [place] MFM OB FOLLOW UP                   76816.01    RAVI [facility]  2  [place] MFM OB TRANSVAGINAL                13086.5     RAVI [facility] ----------------------------------------------------------------------  #  Order #                     Accession #                Episode #  1  [id number]                   [id number]                 [id number]  2  [id number]                   [id number]                 [id number] ---------------------------------------------------------------------- Indications  Encounter for cervical length                  Z36.86  Poor obstetric history: Previous preterm       O09.219  delivery, antepartum (@28  weeks due to  placental abruption)  [redacted] weeks gestation of pregnancy                Z3A.22  Encounter  for other antenatal screening        Z36.2  follow-up ---------------------------------------------------------------------- Vital Signs                                                 Height:        5'3" ---------------------------------------------------------------------- Fetal Evaluation  Num Of Fetuses:         1  Fetal Heart Rate(bpm):  155  Cardiac Activity:       Observed  Presentation:           Variable  Placenta:               Posterior  P. Cord Insertion:      Previously Visualized  Amniotic Fluid  AFI  FV:      Within normal limits                              Largest Pocket(cm)                              6.8 ---------------------------------------------------------------------- Biometry  BPD:      54.7  mm     G. Age:  22w 5d         56  %    CI:        72.46   %    70 - 86                                                          FL/HC:      18.5   %    18.4 - 20.2  HC:      204.4  mm     G. Age:  22w 4d         42  %    HC/AC:      1.06        1.06 - 1.25  AC:      193.3  mm     G. Age:  24w 0d         87  %    FL/BPD:     69.1   %    71 - 87  FL:       37.8  mm     G. Age:  22w 1d         28  %    FL/AC:      19.6   %    20 - 24  HUM:      37.6  mm     G. Age:  23w 1d         65  %  Est. FW:     563  gm      1 lb 4 oz     77  % ---------------------------------------------------------------------- OB History  Gravidity:    2         Term:   0        Prem:   1  Living:       1 ---------------------------------------------------------------------- Gestational Age  LMP:  22w 3d        Date:  02/15/20                 EDD:   11/21/20  U/S Today:     22w 6d                                        EDD:   11/18/20  Best:          22w 3d     Det. By:  LMP  (02/15/20)          EDD:   11/21/20 ---------------------------------------------------------------------- Anatomy  Cranium:               Appears normal         Aortic Arch:            Previously seen  Cavum:                 Appears normal         Ductal Arch:            Previously seen  Ventricles:            Appears normal         Diaphragm:              Appears normal  Choroid Plexus:        Appears normal         Stomach:                Appears normal, left                                                                        sided  Cerebellum:            Appears normal         Abdomen:                Appears normal  Posterior Fossa:       Previously seen        Abdominal Wall:         Appears nml (cord                                                                         insert, abd wall)  Nuchal Fold:           Previously seen        Cord Vessels:           Appears normal (3  vessel cord)  Face:                  Appears normal         Kidneys:                Appear normal                         (orbits and profile)  Lips:                  Previously seen        Bladder:                Appears normal  Thoracic:              Appears normal         Spine:                  Previously seen  Heart:                 Previously seen        Upper Extremities:      Previously seen  RVOT:                  Previously seen        Lower Extremities:      Previously seen  LVOT:                  Appears normal  Other:  Heels and 5th digit previously visualized. Fetus appears to be a female.          Open hands previously visualized. Nasal bone visualized. ---------------------------------------------------------------------- Cervix Uterus Adnexa  Cervix  Length:            4.2  cm.  Normal appearance by transvaginal scan  Uterus  No abnormality visualized.  Right Ovary  Not visualized.  Left Ovary  Not visualized.  Cul De Sac  No free fluid seen.  Adnexa  No abnormality visualized. ---------------------------------------------------------------------- Impression  Patient returns for fetal growth assessment and cervical  length measurement.  Obstetric history significant for a  preterm delivery at [redacted] weeks gestation possibly from  placental abruption.  Patient had MFM consultation and a  plan was made for cervical length measurement every 2  weeks till [redacted] weeks gestation.  Fetal growth is appropriate for gestational age .Amniotic fluid  is normal and good fetal activity is seen .  We performed  transvaginal ultrasound.  The cervix measures 4.2 cm, which  is within normal limits.  We reassured the patient of the findings. ---------------------------------------------------------------------- Recommendations  -An  appointment was made for her to return in 8 weeks for  fetal growth assessment (poor obstetric history). ----------------------------------------------------------------------                  Tama High, MD Electronically Signed Final Report   07/21/2020 05:06 pm ----------------------------------------------------------------------   MAU Course/MDM: Orders Placed This Encounter  Procedures   OB Urine Culture   Urinalysis, Routine w reflex microscopic Urine, Clean Catch   Discharge patient    No orders of the defined types were placed in this encounter.  Assessment: 1. Pain of round ligament affecting pregnancy, antepartum:  -pt declined pelvic exam and GU swabs given no reported vaginal symptoms -f/u Urine culture given symptoms despite overall reassuring UA -provided reassurance and recommended use of belly band, heating pad and tylenol as needed for discomfort; also  encouraged good po intake  2. Supervision of high risk pregnancy, antepartum  -plan for f/u prenatal appointment on 9/2; provided strict return precautions as noted below  3. Previous preterm delivery, antepartum: No signs/symptoms of preterm labor. Deferred GU exam per pt preference.   Plan: Discharge home with plans for prenatal f/u on 9/2 Preterm labor & bleeding precautions  Allergies as of 07/23/2020      Reactions   Amoxicillin Other (See Comments)   Childhood allergy, suffocating feeling, rash also       Medication List    TAKE these medications   aspirin EC 81 MG tablet Take 1 tablet (81 mg total) by mouth daily.   ferrous sulfate 325 (65 FE) MG tablet Commonly known as: FerrouSul Take 1 tablet (325 mg total) by mouth 2 (two) times daily.   MAKENA IM Inject into the muscle.   multivitamin-prenatal 27-0.8 MG Tabs tablet Take 1 tablet by mouth daily at 12 noon.   Vitamin D (Ergocalciferol) 1.25 MG (50000 UNIT) Caps capsule Commonly known as: DRISDOL Take 1 capsule (50,000 Units total) by  mouth once a week.      Guillermina [place], MD OB Fellow, [facility] 07/23/2020 3:44 PM

## 2020-07-23 NOTE — MAU Note (Signed)
Candice Ward is a 34 y.o. at 82w5dhere in MAU reporting: left sided back pain for the past 2-3 hours, pain radiates towards abdomen. Pain is constant but worse when walking and using the restroom. No bleeding or LOF.  Onset of complaint: today  Pain score: 6/10  Vitals:   07/23/20 1255  BP: (!) 105/59  Pulse: 97  Resp: 18  Temp: 98.5 F (36.9 C)  SpO2: 100%     Lab orders placed from triage: UA, unable to give sample at this time

## 2020-07-23 NOTE — MAU Note (Signed)
Pt declined vaginal swabs. Dr Astrid Drafts made aware and states she will cancel orders

## 2020-07-24 LAB — CULTURE, OB URINE: Culture: NO GROWTH

## 2020-08-11 ENCOUNTER — Other Ambulatory Visit: Payer: Self-pay

## 2020-08-11 ENCOUNTER — Ambulatory Visit (INDEPENDENT_AMBULATORY_CARE_PROVIDER_SITE_OTHER): Payer: No Typology Code available for payment source | Admitting: Medical

## 2020-08-11 ENCOUNTER — Encounter: Payer: Self-pay | Admitting: Medical

## 2020-08-11 VITALS — BP 103/71 | HR 96 | Wt 135.0 lb

## 2020-08-11 DIAGNOSIS — Z3A25 25 weeks gestation of pregnancy: Secondary | ICD-10-CM

## 2020-08-11 DIAGNOSIS — O09219 Supervision of pregnancy with history of pre-term labor, unspecified trimester: Secondary | ICD-10-CM

## 2020-08-11 DIAGNOSIS — Z8759 Personal history of other complications of pregnancy, childbirth and the puerperium: Secondary | ICD-10-CM

## 2020-08-11 DIAGNOSIS — D508 Other iron deficiency anemias: Secondary | ICD-10-CM

## 2020-08-11 DIAGNOSIS — O0990 Supervision of high risk pregnancy, unspecified, unspecified trimester: Secondary | ICD-10-CM

## 2020-08-11 NOTE — Progress Notes (Signed)
   PRENATAL VISIT NOTE  Subjective:  Candice Ward is a 34 y.o. G2P0101 at 51w3dbeing seen today for ongoing prenatal care.  She is currently monitored for the following issues for this high-risk pregnancy and has Supervision of high risk pregnancy, antepartum; History of placental abruption; Previous preterm delivery, antepartum; Iron deficiency anemia; and Vitamin D deficiency on their problem list.  Patient reports no complaints.  Contractions: Not present. Vag. Bleeding: None.  Movement: Present. Denies leaking of fluid.   The following portions of the patient's history were reviewed and updated as appropriate: allergies, current medications, past family history, past medical history, past social history, past surgical history and problem list.   Objective:   Vitals:   08/11/20 1345  BP: 103/71  Pulse: 96  Weight: 135 lb (61.2 kg)    Fetal Status: Fetal Heart Rate (bpm): 131 Fundal Height: 26 cm Movement: Present     General:  Alert, oriented and cooperative. Patient is in no acute distress.  Skin: Skin is warm and dry. No rash noted.   Cardiovascular: Normal heart rate noted  Respiratory: Normal respiratory effort, no problems with respiration noted  Abdomen: Soft, gravid, appropriate for gestational age.  Pain/Pressure: Absent     Pelvic: Cervical exam deferred        Extremities: Normal range of motion.  Edema: None  Mental Status: Normal mood and affect. Normal behavior. Normal judgment and thought content.   Assessment and Plan:  Pregnancy: G2P0101 at 261w3d. Supervision of high risk pregnancy, antepartum - Doing well - Planning to breastfeed - [facility] change [facility], list given   The patient was counseled on the potential benefits and lack of known risks of COVID vaccination, during pregnancy and breastfeeding, on today's visit. The patient's questions and concerns were addressed today, including timing of second dose. The patient is planning to get  vaccinated.  2. History of placental abruption - No bleeding today  - Taking ASA  - Follow-up USKoreacheduled 10/7 for growth   3. Previous preterm delivery, antepartum - Due to placental abruption  - Husband is injecting 17-P weekly   4. Iron deficiency anemia secondary to inadequate dietary iron intake - CBC at last visit normal following Fereheme   5. [redacted] weeks gestation of pregnancy  Preterm labor symptoms and general obstetric precautions including but not limited to vaginal bleeding, contractions, leaking of fluid and fetal movement were reviewed in detail with the patient. Please refer to After Visit Summary for other counseling recommendations.   Return in about 4 weeks (around 09/08/2020) for [facility], 28 week labs (fasting), In-Person.  Future Appointments  Date Time Provider DeBraddock10/06/2020  3:00 PM [facility] US1 [facility]  JuKerry HoughPA-C

## 2020-08-11 NOTE — Patient Instructions (Addendum)
Second Trimester of Pregnancy  The second trimester is from week 14 through week 27 (month 4 through 6). This is often the time in pregnancy that you feel your best. Often times, morning sickness has lessened or quit. You may have more energy, and you may get hungry more often. Your unborn baby is growing rapidly. At the end of the sixth month, he or she is about 9 inches long and weighs about 1 pounds. You will likely feel the baby move between 18 and 20 weeks of pregnancy. Follow these instructions at home: Medicines  Take over-the-counter and prescription medicines only as told by your doctor. Some medicines are safe and some medicines are not safe during pregnancy.  Take a prenatal vitamin that contains at least 600 micrograms (mcg) of folic acid.  If you have trouble pooping (constipation), take medicine that will make your stool soft (stool softener) if your doctor approves. Eating and drinking   Eat regular, healthy meals.  Avoid raw meat and uncooked cheese.  If you get low calcium from the food you eat, talk to your doctor about taking a daily calcium supplement.  Avoid foods that are high in fat and sugars, such as fried and sweet foods.  If you feel sick to your stomach (nauseous) or throw up (vomit): ? Eat 4 or 5 small meals a day instead of 3 large meals. ? Try eating a few soda crackers. ? Drink liquids between meals instead of during meals.  To prevent constipation: ? Eat foods that are high in fiber, like fresh fruits and vegetables, whole grains, and beans. ? Drink enough fluids to keep your pee (urine) clear or pale yellow. Activity  Exercise only as told by your doctor. Stop exercising if you start to have cramps.  Do not exercise if it is too hot, too humid, or if you are in a place of great height (high altitude).  Avoid heavy lifting.  Wear low-heeled shoes. Sit and stand up straight.  You can continue to have sex unless your doctor tells you not  to. Relieving pain and discomfort  Wear a good support bra if your breasts are tender.  Take warm water baths (sitz baths) to soothe pain or discomfort caused by hemorrhoids. Use hemorrhoid cream if your doctor approves.  Rest with your legs raised if you have leg cramps or low back pain.  If you develop puffy, bulging veins (varicose veins) in your legs: ? Wear support hose or compression stockings as told by your doctor. ? Raise (elevate) your feet for 15 minutes, 3-4 times a day. ? Limit salt in your food. Prenatal care  Write down your questions. Take them to your prenatal visits.  Keep all your prenatal visits as told by your doctor. This is important. Safety  Wear your seat belt when driving.  Make a list of emergency phone numbers, including numbers for family, friends, the hospital, and police and fire departments. General instructions  Ask your doctor about the right foods to eat or for help finding a counselor, if you need these services.  Ask your doctor about local prenatal classes. Begin classes before month 6 of your pregnancy.  Do not use hot tubs, steam rooms, or saunas.  Do not douche or use tampons or scented sanitary pads.  Do not cross your legs for long periods of time.  Visit your dentist if you have not done so. Use a soft toothbrush to brush your teeth. Floss gently.  Avoid all smoking, herbs,  and alcohol. Avoid drugs that are not approved by your doctor.  Do not use any products that contain nicotine or tobacco, such as cigarettes and e-cigarettes. If you need help quitting, ask your doctor.  Avoid cat litter boxes and soil used by cats. These carry germs that can cause birth defects in the baby and can cause a loss of your baby (miscarriage) or stillbirth. Contact a doctor if:  You have mild cramps or pressure in your lower belly.  You have pain when you pee (urinate).  You have bad smelling fluid coming from your vagina.  You continue to  feel sick to your stomach (nauseous), throw up (vomit), or have watery poop (diarrhea).  You have a nagging pain in your belly area.  You feel dizzy. Get help right away if:  You have a fever.  You are leaking fluid from your vagina.  You have spotting or bleeding from your vagina.  You have severe belly cramping or pain.  You lose or gain weight rapidly.  You have trouble catching your breath and have chest pain.  You notice sudden or extreme puffiness (swelling) of your face, hands, ankles, feet, or legs.  You have not felt the baby move in over an hour.  You have severe headaches that do not go away when you take medicine.  You have trouble seeing. Summary  The second trimester is from week 14 through week 27 (months 4 through 6). This is often the time in pregnancy that you feel your best.  To take care of yourself and your unborn baby, you will need to eat healthy meals, take medicines only if your doctor tells you to do so, and do activities that are safe for you and your baby.  Call your doctor if you get sick or if you notice anything unusual about your pregnancy. Also, call your doctor if you need help with the right food to eat, or if you want to know what activities are safe for you. This information is not intended to replace advice given to you by your health care provider. Make sure you discuss any questions you have with your health care provider. Document Revised: 03/20/2019 Document Reviewed: 01/01/2017 Elsevier Patient Education  2020 [place] [phone]) . [facility] Davy Pique, MD; Gwendlyn Deutscher, MD; Walker Kehr, MD; Andria Frames, MD; McDiarmid, MD; Dutch Quint, MD; Nori Riis, MD; [place]ingo [address], [address] [place], [place] [address] o [phone] o Mon-Fri 8:30-12:30, 1:30-5:00 o Providers come to see babies at [facility] o Accepting Medicaid . [place]  at [place] providers who accept newborns: Dorthy Cooler, MD; Orland Mustard, MD; Stephanie Acre, MD o [place], [place], [place] [address] o [phone] o Mon-Fri 8:00-5:30 o Babies seen by providers at [facility] o Does NOT accept Medicaid o Please call early in hospitalization for appointment (limited availability)  . Mustard Gallatin, MD o [address], [place], [place] [address] o [phone] o Mon, Tue, Thur, Fri 8:30-5:00, Wed 10:00-7:00 (closed 1-2pm) o Babies seen by [facility] providers o Accepting Medicaid . [place], MD o [place], [place], [place] [address] o [phone] o Mon-Fri 8:30-5:00, Sat 8:30-12:00 o Provider comes to see babies at [place] Medicaid o Must have been referred from current patients or contacted office prior to delivery . [facility] (  [place] [facility]) Franne Forts, MD; Tamera Punt, MD; Doneen Poisson, MD; Fatima Sanger, MD; Wynetta Emery, MD; Jess Barters, MD; Tami Ribas, MD; Herbert Moors, MD; Derrell Lolling, MD; Dorothyann Peng, MD; Lucious Groves, NP; Baldo Ash, NP o [address], [place], [place] [address] o [phone] o Mon, Tue, Thur, Fri 8:30-5:30, Wed 9:30-5:30, Sat 8:30-12:30 o Babies seen by [facility] providers o Accepting Medicaid o Only accepting infants of first-time parents or siblings of current patients [facility] discharge coordinator will make follow-up appointment . Baltazar Najjar o [place] [address], [place], [place]  [address] o [phone]   Fax - [phone] . [facility] o [address], [place], [place]  [address] o Phone - [phone]   Fax [phone] . [place], [place], [place], [place]  [address] o [phone]  [place] [phone]) . [facility] Reginal Lutes, MD; Jacklynn Ganong, MD; Torrie Mayers, MD; MD; Rosana Hoes, MD; Servando Salina, MD;  Rose Fillers, MD; Rex Kras, MD; Corinna Capra, MD; Volney American, MD; Trilby Drummer, MD; Janann Colonel, MD; Jimmye Norman, [address], [place], [place] [address] o [phone] o Mon-Fri 8:30-5:00 (extended evenings Mon-Thur as needed), Sat-Sun 10:00-1:00 o Providers come to see babies at [place] Medicaid for families of first-time babies and families with all children in the household age 86 and under. Must register with office prior to making appointment (M-F only). . [place], NP; Tomi Bamberger, MD; Redmond School, MD; [place], [address] [employer], [place] [address] o [phone] o Mon-Fri 8:00-5:00 o Babies seen by providers at [facility] o Does NOT accept Medicaid/Commercial Insurance Only . [facility] at [place] ([facility])  Marnee Guarneri, MD; Drema [place], MD; Montine Circle, MD; Vilma Prader, MD; Vanita Panda, MD; Alfonso Ramus, MD; Ruthann Cancer, MD; Roxanne Mins, MD; Rosalva Ferron, MD; Polly Cobia, MD o [address] [place], [place] [address] o [phone] o Mon-Fri 8:30-5:30, Sat (Oct.-Mar.) 9:00-1:00 o Babies seen by providers at [place] [phone]) . [facility] Peers, MD; Suzan Slick, MD o [address], [place], [place] [address] o [phone] o Mon-Fri 8:30-5:00, Sat 8:30-12:00 o Providers come to see babies at [facility] o Does NOT accept Medicaid . [place] at [place], [place]; [place], MD; [place], [place]; Nancy Fetter, MD; Moreen Fowler, [place], [place], [place] [address] o [phone] o Mon-Fri 8:00-5:00 o Babies seen by providers at [facility] o Does NOT accept Medicaid o Only accepting babies of parents who are patients o Please call early in hospitalization for appointment (limited availability) . [facility] Pediatric[facility] Blanca Friend, MD; Sharlene Motts, MD; Rod Can, MD; Warner Mccreedy, NP; Sabra Heck, MD; Ermalinda Memos, MD; Sharlett Iles, NP; Aurther Loft, MD; Jerrye Beavers, MD; Marcello Moores, MD;  Berline Lopes, MD; Charolette Forward, MD o [place]. [place], [place], [place] [address] o [phone] o Mon-Fri 8:00-5:00, Sat 9:00-12:00 o Providers come to see babies at [facility] o Does NOT accept [facility] [phone]) . [place] at [place] providers accepting new patients: Dayna Ramus, NP; [place], [place], [place],  [address] o [phone] o Mon-Fri 8:00-5:00 o Babies seen by providers at [facility] o Does NOT accept Medicaid o Only accepting babies of parents who are patients o Please call early in hospitalization for appointment (limited availability) . [facility] Oswaldo Conroy, MD; Sheran Lawless, MD o [address] [place],  [address] o [phone] (press 1 to schedule appointment) o Mon-Fri 8:00-5:00 o Providers come to  see babies at [facility] o Does NOT accept Medicaid . [facility] Jodi Mourning, MD o [address], [place], [place] [address] o [phone] o Mon-Fri 8:30-5:00 (lunch 12:30-1:00), extended hours by appointment only Wed 5:00-6:30 o Babies seen by [facility] providers o Accepting Medicaid . [place] at Evalyn Casco, MD; [place], MD; Ethlyn Gallery, MD o [place], [place], [place] [address] o [phone] o Mon-Fri 8:00-5:00 o Babies seen by [facility] providers o Does NOT accept Medicaid . Therapist, music at [place], MD; Yong Channel, MD; [place], [employer] [address] [place], [place] [address] o [phone] o Mon-Fri 8:00-5:00 o Babies seen by [facility] providers o Does NOT accept Medicaid . [place], [place]; [employer], [place]; [place], NP; Albertina Parr, MD; Frederic Jericho, MD; Ronney Lion, MD; Carlos Levering, NP; Jerelene Redden, NP; Tomasita Crumble, NP; Ronelle Nigh, NP; Corinna Lines, MD; [place], MD o [address] [place], [place] [address] o [phone] o Mon-Fri 8:30-5:00, Sat 10:00-1:00 o Providers come to see babies at [facility] o Does NOT accept Medicaid o Free prenatal information session Tuesdays at 4:45pm . [facility] Porfirio Oar, MD; [place], [place]; [place], [place]; [place], [address]., [place] [address] o [phone] o Mon-Fri 7:30-5:30 o Babies seen by [facility] providers . [facility] Children[facility] Doctor o [address] [place], [place], [place]  [address] o [phone]   Fax - [phone]  [place] [phone] & [phone]) . [place] o [address] [place], [place] [address] o [phone] o Mon-Thur 8:00-6:00 o Providers come to see babies at [place] Medicaid . [place], NP; Melford Aase, MD; [place], [place]; [place], [place]., [place], [place] [address] o [phone] o Mon-Thur 7:30-7:30, Fri 7:30-4:30 o Babies seen by [facility] providers o Accepting Medicaid . [facility] Nyra Jabs, MD; Cristino Martes, NP; Gertie Baron, MD o [place] [address], [place], [place] [address] o [phone] o Mon-Fri 8:30-5:00, Sat 8:30-12:00 o Providers come to see babies at [place] Medicaid o Must have "Meet & Greet" appointment at office prior to delivery . [place] ([place]) Jodene Nam, MD; Juleen China, MD; Clydene Laming, [place] [address], [place], [place] [address] o [phone] o Mon-Wed 8:00-6:00, Thur-Fri 8:00-5:00, Sat 9:00-12:00 o Providers come to see babies at [facility] o Does NOT accept Medicaid o Only accepting siblings of current patients . [facility] [place], [place], [place], [place]  [address] o [phone]   Fax [phone] . [place] at [address], [place], [place]  [address] o [phone]   Fax - [place] [phone] & [phone]) . Forensic psychologist at [place], DO; [place], [address] [place], [place] [address] o [phone] o Mon-Fri 7:00-5:00 o Babies seen by [facility] providers o Does NOT accept Medicaid . [place], MD; [place], [place]; [place], [place], [place], [place] [address] o [phone] o Mon-Fri 8:00-5:00 o Babies seen by [facility] providers o Accepting Medicaid . [place], MD; [place], [place]; Escob[place], NP; [place], [place], [place],  [address] o [phone] o Mon-Fri 8:00-5:00 o Babies seen by providers at [facility] o  Accepting Medicaid  [employer] [phone]) . [facility] [place], [place] o [address] [place], [place] [address] o [phone] o Mon-Fri 8:00-5:00 o Babies seen by [facility] providers o Does NOT accept Medicaid o Limited availability, please call early in hospitalization to schedule follow-up . [facility] Leilani Merl, PA; Maisie Fus, MD; [place], MD; [place], [place]; Jeannine Kitten, MD; [place], [place] [facility] [address], [place], [place] [address] o [phone] o Mon-Fri 8:30-5:00, Sat 9:00-12:00 o Babies seen by providers at [facility] o Accepting Medicaid o Please register online then schedule online or call office o [email or web address] . [place] ([place]) Kristian Covey, NP; Dwyane Dee, MD; Leonidas Romberg, PA o [address] [place], [place], [place] [address] o [phone] o Mon-Fri 8:00-5:00 o Babies seen by providers at [facility] o Accepting Medicaid . [place] ([facility] [employer]) Dairl Ponder, MD; Rayvon Char, NP; Melina Modena, MD o [address] [place], [place], [place] [address] o [phone] o Mon-Fri 8:00-5:30, Sat&Sun by  appointment (phones open at 8:30) o Babies seen by [facility] providers o Accepting Medicaid o Must be a first-time baby or sibling of current patient . [place], [address], [place], [place]  [address] o [phone]   Fax - [phone]  [place] [phone] & [phone]) . [place], [place]; [place], [place]; Benjamine Mola, MD; [place], [place]; Harrell Lark, MD o [address], [place], [place] [address] o [phone] o Mon-Thur 8:00-7:00, Fri 8:00-5:00, Sat 8:00-12:00, Sun 9:00-12:00 o Babies seen by [facility] providers o Accepting Medicaid . [facility] at [facility], MD; Ruthann Cancer, MD; [facility], MD o [address], [place], [place] [address] o [phone] o Mon-Thur 8:00-5:00 o Babies seen by providers at [facility] o Accepting Medicaid . [facility] at [place], MD; Coe[place]Go[place], MD; Amedeo Plenty, MD; Bobby Rumpf, MD; List, MD; Lavonia Drafts, MD; Ruthann Cancer, MD; Selinda Eon, MD; Audie Box, MD; Jim Like, MD; Christie Nottingham, MD; Hubbard Hartshorn, MD; Modena Nunnery, MD o [address] [place], [place] [address] o [phone] o Mon-Fri 8:00-5:30, Sat (Oct.-Mar.) 9:00-1:00 o Babies seen by providers at [facility] o Accepting Medicaid o Must fill out new patient packet, available online at [email or web address] . [place] ([facility]) Barnabas Lister, NP; Kenton Kingfisher, NP; Claiborne Billings, NP; Rolla Plate, MD; [place], [place]; Carola Rhine, MD; Tyron Russell, MD; Delia Chimes, NP o [address], [place], [place] [address] o [phone] o Mon-Thur 8:00-5:30, Fri 8:00-5:00 o Babies seen by providers at [place] [phone]) . [place], [place]; [place], MD; Dennard Schaumann, MD; [place], [place] o [address] [place], [place] [address] o [phone] o Mon-Fri 8:00-5:00 o Babies seen  by providers at [place] [phone]) . [place] at [place], [place]; Olen Pel, MD; [place], [place], [place], [place] [address] o [phone] o Mon-Fri 8:00-5:00 o Babies seen by providers at [facility] o Does NOT accept Medicaid o Limited appointment availability, please call early in hospitalization  . Therapist, music at [place], [place]; [place], [address], [place], [place] [address] o [phone] o  Mon-Fri 8:00-5:00 o Babies seen by [facility] providers o Does NOT accept Medicaid . [facility] Su Grand, MD; Guy Sandifer, MD; [place], [place]; [place], [place] [address], [place], [place] [address] o [phone] o Mon-Fri 8:00-5:00 o After hours clinic [facility] [place], [place] [address]) [phone] Mon-Fri 5:00-8:00, Sat 12:00-6:00, Sun 10:00-4:00 o Babies seen by [facility] providers o Accepting Medicaid . [place] at [facility] o [address] [place], [place]  [address] o [phone]   Fax - [phone]  [place] [phone]) . Therapist, music at [facility], MD o [address] [place], [place], [place] [address] o [phone] o Mon-Fri 8:00-5:00 o Babies seen by [facility] providers o Does NOT accept Medicaid . [place] ([place] at [place]) Bing Neighbors, MD o [address] [place], [place], [place] [address] o [phone] o Mon-Thur 8:00-7:00, Fri 8:00-5:00, Sat 8:00-12:00 o Babies seen by providers at [facility] o Accepting Medicaid - but does not have vaccinations in office (must be received elsewhere) o Limited availability, please call early in hospitalization  [place] ([phone]) . [place], [place] [address], [place] [address] o [phone]  Fax [phone]

## 2020-08-18 ENCOUNTER — Encounter: Payer: Self-pay | Admitting: *Deleted

## 2020-09-05 ENCOUNTER — Other Ambulatory Visit: Payer: Self-pay | Admitting: General Practice

## 2020-09-05 DIAGNOSIS — O0990 Supervision of high risk pregnancy, unspecified, unspecified trimester: Secondary | ICD-10-CM

## 2020-09-09 ENCOUNTER — Ambulatory Visit (INDEPENDENT_AMBULATORY_CARE_PROVIDER_SITE_OTHER): Payer: No Typology Code available for payment source | Admitting: Obstetrics & Gynecology

## 2020-09-09 ENCOUNTER — Other Ambulatory Visit: Payer: No Typology Code available for payment source

## 2020-09-09 ENCOUNTER — Other Ambulatory Visit: Payer: Self-pay

## 2020-09-09 ENCOUNTER — Encounter: Payer: Self-pay | Admitting: Obstetrics & Gynecology

## 2020-09-09 VITALS — BP 107/71 | HR 93 | Wt 143.0 lb

## 2020-09-09 DIAGNOSIS — O0990 Supervision of high risk pregnancy, unspecified, unspecified trimester: Secondary | ICD-10-CM

## 2020-09-09 DIAGNOSIS — Z3A29 29 weeks gestation of pregnancy: Secondary | ICD-10-CM

## 2020-09-09 DIAGNOSIS — O09219 Supervision of pregnancy with history of pre-term labor, unspecified trimester: Secondary | ICD-10-CM

## 2020-09-09 NOTE — Patient Instructions (Addendum)
Return to office for any scheduled appointments. Call the office or go to the MAU at [place] at [facility] if:  You begin to have strong, frequent contractions  Your water breaks.  Sometimes it is a big gush of fluid, sometimes it is just a trickle that keeps getting your panties wet or running down your legs  You have vaginal bleeding.  It is normal to have a small amount of spotting if your cervix was checked.   You do not feel your baby moving like normal.  If you do not, get something to eat and drink and lay down and focus on feeling your baby move.   If your baby is still not moving like normal, you should call the office or go to MAU.  Any other obstetric concerns.   Vitamin D Deficiency Vitamin D deficiency is when your body does not have enough vitamin D. Vitamin D is important to your body for many reasons:  It helps the body absorb two important minerals--calcium and phosphorus.  It plays a role in bone health.  It may help to prevent some diseases, such as diabetes and multiple sclerosis.  It plays a role in muscle function, including heart function. If vitamin D deficiency is severe, it can cause a condition in which your bones become soft. In adults, this condition is called osteomalacia. In children, this condition is called rickets. What are the causes? This condition may be caused by:  Not eating enough foods that contain vitamin D.  Not getting enough natural sun exposure.  Having certain digestive system diseases that make it difficult for your body to absorb vitamin D. These diseases include Crohn's disease, chronic pancreatitis, and cystic fibrosis.  Having a surgery in which a part of the stomach or a part of the small intestine is removed.  Having chronic kidney disease or liver disease. What increases the risk? You are more likely to develop this condition if you:  Are older.  Do not spend much time outdoors.  Live in a long-term  care facility.  Have had broken bones.  Have weak or thin bones (osteoporosis).  Have a disease or condition that changes how the body absorbs vitamin D.  Have dark skin.  Take certain medicines, such as steroid medicines or certain seizure medicines.  Are overweight or obese. What are the signs or symptoms? In mild cases of vitamin D deficiency, there may not be any symptoms. If the condition is severe, symptoms may include:  Bone pain.  Muscle pain.  Falling often.  Broken bones caused by a minor injury. How is this diagnosed? This condition may be diagnosed with blood tests. Imaging tests such as X-rays may also be done to look for changes in the bone. How is this treated? Treatment for this condition may depend on what caused the condition. Treatment options include:  Taking vitamin D supplements. Your health care provider will suggest what dose is best for you.  Taking a calcium supplement. Your health care provider will suggest what dose is best for you. Follow these instructions at home: Eating and drinking   Eat foods that contain vitamin D. Choices include: ? Fortified dairy products, cereals, or juices. Fortified means that vitamin D has been added to the food. Check the label on the package to see if the food is fortified. ? Fatty fish, such as salmon or trout. ? Eggs. ? Oysters. ? Mushrooms. The items listed above may not be a complete list  of recommended foods and beverages. Contact a dietitian for more information. General instructions  Take medicines and supplements only as told by your health care provider.  Get regular, safe exposure to natural sunlight.  Do not use a tanning bed.  Maintain a healthy weight. Lose weight if needed.  Keep all follow-up visits as told by your health care provider. This is important. How is this prevented? You can get vitamin D by:  Eating foods that naturally contain vitamin D.  Eating or drinking products that  have been fortified with vitamin D, such as cereals, juices, and dairy products (including milk).  Taking a vitamin D supplement or a multivitamin supplement that contains vitamin D.  Being in the sun. Your body naturally makes vitamin D when your skin is exposed to sunlight. Your body changes the sunlight into a form of the vitamin that it can use. Contact a health care provider if:  Your symptoms do not go away.  You feel nauseous or you vomit.  You have fewer bowel movements than usual or are constipated. Summary  Vitamin D deficiency is when your body does not have enough vitamin D.  Vitamin D is important to your body for good bone health and muscle function, and it may help prevent some diseases.  Vitamin D deficiency is primarily treated through supplementation. Your health care provider will suggest what dose is best for you.  You can get vitamin D by eating foods that contain vitamin D, by being in the sun, and by taking a vitamin D supplement or a multivitamin supplement that contains vitamin D. This information is not intended to replace advice given to you by your health care provider. Make sure you discuss any questions you have with your health care provider. Document Revised: 08/04/2018 Document Reviewed: 08/04/2018 Elsevier Patient Education  [place].

## 2020-09-09 NOTE — Progress Notes (Signed)
   PRENATAL VISIT NOTE  Subjective:  Candice Ward is a 34 y.o. G2P0101 at 69w4dbeing seen today for ongoing prenatal care. Accompanied by   She is currently monitored for the following issues for this high-risk pregnancy and has Supervision of high risk pregnancy, antepartum; History of placental abruption; Previous preterm delivery, antepartum; Iron deficiency anemia; and Vitamin D deficiency on their problem list.  Patient reports no complaints.  Contractions: Not present. Vag. Bleeding: None.  Movement: Present. Denies leaking of fluid.   The following portions of the patient's history were reviewed and updated as appropriate: allergies, current medications, past family history, past medical history, past social history, past surgical history and problem list.   Objective:   Vitals:   09/09/20 0842  BP: 107/71  Pulse: 93  Weight: 143 lb (64.9 kg)    Fetal Status: Fetal Heart Rate (bpm): 164   Movement: Present     General:  Alert, oriented and cooperative. Patient is in no acute distress.  Skin: Skin is warm and dry. No rash noted.   Cardiovascular: Normal heart rate noted  Respiratory: Normal respiratory effort, no problems with respiration noted  Abdomen: Soft, gravid, appropriate for gestational age.  Pain/Pressure: Absent     Pelvic: Cervical exam deferred        Extremities: Normal range of motion.  Edema: None  Mental Status: Normal mood and affect. Normal behavior. Normal judgment and thought content.   Assessment and Plan:  Pregnancy: G2P0101 at 222w4d. Previous preterm delivery, antepartum Continue weekly 17P injections  2. [redacted] weeks gestation of pregnancy 3. Supervision of high risk pregnancy, antepartum Third trimester labs today.  Is getting COVID vaccine series. Wants to postpone Flu and Tdap vaccines until later. Preterm labor symptoms and general obstetric precautions including but not limited to vaginal bleeding, contractions, leaking of fluid and fetal  movement were reviewed in detail with the patient. Please refer to After Visit Summary for other counseling recommendations.   Return in about 2 weeks (around 09/23/2020) for Virtual OB Visit.  Future Appointments  Date Time Provider [place]  3:00 PM [facility] US[facility]  UgVerita SchneidersMD

## 2020-09-10 LAB — GLUCOSE TOLERANCE, 2 HOURS W/ 1HR
Glucose, 1 hour: 90 mg/dL (ref 65–179)
Glucose, 2 hour: 96 mg/dL (ref 65–152)
Glucose, Fasting: 74 mg/dL (ref 65–91)

## 2020-09-10 LAB — CBC
Hematocrit: 36.8 % (ref 34.0–46.6)
Hemoglobin: 12.4 g/dL (ref 11.1–15.9)
MCH: 30.5 pg (ref 26.6–33.0)
MCHC: 33.7 g/dL (ref 31.5–35.7)
MCV: 90 fL (ref 79–97)
Platelets: 258 10*3/uL (ref 150–450)
RBC: 4.07 x10E6/uL (ref 3.77–5.28)
RDW: 12.4 % (ref 11.7–15.4)
WBC: 8.9 10*3/uL (ref 3.4–10.8)

## 2020-09-10 LAB — RPR: RPR Ser Ql: NONREACTIVE

## 2020-09-10 LAB — HIV ANTIBODY (ROUTINE TESTING W REFLEX): HIV Screen 4th Generation wRfx: NONREACTIVE

## 2020-09-15 ENCOUNTER — Other Ambulatory Visit: Payer: Self-pay

## 2020-09-15 ENCOUNTER — Ambulatory Visit: Payer: No Typology Code available for payment source | Attending: Obstetrics and Gynecology

## 2020-09-15 ENCOUNTER — Other Ambulatory Visit: Payer: Self-pay | Admitting: *Deleted

## 2020-09-15 DIAGNOSIS — Z362 Encounter for other antenatal screening follow-up: Secondary | ICD-10-CM

## 2020-09-15 DIAGNOSIS — Z3A30 30 weeks gestation of pregnancy: Secondary | ICD-10-CM

## 2020-09-15 DIAGNOSIS — O09899 Supervision of other high risk pregnancies, unspecified trimester: Secondary | ICD-10-CM | POA: Insufficient documentation

## 2020-09-15 DIAGNOSIS — O09213 Supervision of pregnancy with history of pre-term labor, third trimester: Secondary | ICD-10-CM

## 2020-09-19 ENCOUNTER — Telehealth: Payer: Self-pay | Admitting: *Deleted

## 2020-09-19 NOTE — Telephone Encounter (Signed)
Thomas from [facility] called stating the current authorization for [facility] does not allow for the amount of doses or usage. THey need either an update to the prior authorization or a new prior authorization before refilling her Makena. The provider line number is [phone]

## 2020-09-20 NOTE — Telephone Encounter (Addendum)
I have never done this with the other patients on Makena.  Will forward to Derinda Late, RN (Team Lead) and Verdell Carmine, RN  to see what they usually do for this type of prior authorizations; or give some guidance as to what I need to do.  Is it just a simple phone call, or do I have to send it forms etc?  Thank you!   [name], MD

## 2020-09-21 NOTE — Telephone Encounter (Addendum)
Late Entry- 09/20/20 @ 1557 Received call from Marcello Moores [phone]) [place] that another prior auth is needed for patient to receive Makena.  Patient is needing more units to have Makena to be approved.  Marcello Moores stated that we need to contact [place] at [phone].    Called [employer] @ [phone] was informed that I needed to contact the [place] @ [phone] and fax # [phone] to do a verbal prior auth.    Contacted the [place] and was informed that there was no need for another prior auth for patients Makena.  I was informed that they should not have contacted our office in that [place] should have contacted the pharmacy themselves and not involve the office.   I called the patient and explained to the patient that another prior auth was not needed and that she should be receiving a call from her pharmacy in regards to her [place].  I informed pt that I would call to f/u with them.  Patient stated that she would to.   [place], RN  09/21/20

## 2020-09-22 NOTE — Telephone Encounter (Signed)
Mel Almond, RN spoke with [facility] rep about concerns, who called [place] on a three way call. Call was transferred to [facility] for further management. Spoke with Training and development officer from [employer]. Verified pt and prescriber information at request of pharmacy staff. Pharmacy staff states she is not able to discuss further patient information with a third party on the line. Explained purpose of phone call is to have Makena representative verify that [facility] order is correct. Explained a second prior authorization should not be required for this medication, which is why there is concern for an error. Makena rep states call will be dropped if she gets off of the line. Caelyn states I can call her back for further help after current phone call is discontinued. I requested a direct phone number to finish conversation. Given number [phone]. Called this number, spoke with staff member who could not connect me with [place]. Stated I would need pt ID number. Attempted to call back Makena rep, no answer.

## 2020-09-22 NOTE — Telephone Encounter (Signed)
Called Makena patient care manager @ [phone] ext [phone] and was unable to reach however was able to speak with another rep.

## 2020-09-23 ENCOUNTER — Other Ambulatory Visit: Payer: Self-pay | Admitting: Family Medicine

## 2020-09-23 DIAGNOSIS — O09219 Supervision of pregnancy with history of pre-term labor, unspecified trimester: Secondary | ICD-10-CM

## 2020-09-23 MED ORDER — HYDROXYPROGESTERONE CAPROATE 250 MG/ML IM OIL: 250.0000 mg | TOPICAL_OIL | Freq: Once | INTRAMUSCULAR | 3 refills | Status: DC

## 2020-09-26 ENCOUNTER — Other Ambulatory Visit: Payer: Self-pay | Admitting: Obstetrics & Gynecology

## 2020-09-26 ENCOUNTER — Other Ambulatory Visit: Payer: Self-pay

## 2020-09-26 ENCOUNTER — Telehealth (INDEPENDENT_AMBULATORY_CARE_PROVIDER_SITE_OTHER): Payer: No Typology Code available for payment source | Admitting: Family Medicine

## 2020-09-26 VITALS — BP 110/72

## 2020-09-26 DIAGNOSIS — O09219 Supervision of pregnancy with history of pre-term labor, unspecified trimester: Secondary | ICD-10-CM

## 2020-09-26 DIAGNOSIS — Z8759 Personal history of other complications of pregnancy, childbirth and the puerperium: Secondary | ICD-10-CM

## 2020-09-26 DIAGNOSIS — O09293 Supervision of pregnancy with other poor reproductive or obstetric history, third trimester: Secondary | ICD-10-CM

## 2020-09-26 DIAGNOSIS — O0990 Supervision of high risk pregnancy, unspecified, unspecified trimester: Secondary | ICD-10-CM

## 2020-09-26 DIAGNOSIS — O09213 Supervision of pregnancy with history of pre-term labor, third trimester: Secondary | ICD-10-CM

## 2020-09-26 DIAGNOSIS — Z3A32 32 weeks gestation of pregnancy: Secondary | ICD-10-CM

## 2020-09-26 MED ORDER — HYDROXYPROGESTERONE CAPROATE 250 MG/ML IM OIL: 250.0000 mg | TOPICAL_OIL | Freq: Once | INTRAMUSCULAR | 5 refills | Status: DC

## 2020-09-26 NOTE — Progress Notes (Signed)
OBSTETRICS PRENATAL VIRTUAL VISIT ENCOUNTER NOTE  Provider location: [facility] [place] [facility]   I connected with Candice Ward on 09/27/20 at  9:15 AM EDT by MyChart Video Encounter at home and verified that I am speaking with the correct person using two identifiers.   I discussed the limitations, risks, security and privacy concerns of performing an evaluation and management service virtually and the availability of in person appointments. I also discussed with the patient that there may be a patient responsible charge related to this service. The patient expressed understanding and agreed to proceed. Subjective:  Candice Ward is a 34 y.o. G2P0101 at 43w0dbeing seen today for ongoing prenatal care.  She is currently monitored for the following issues for this high-risk pregnancy and has Supervision of high risk pregnancy, antepartum; History of placental abruption; Previous preterm delivery, antepartum; Iron deficiency anemia; and Vitamin D deficiency on their problem list.  Patient reports no complaints.  Contractions: Not present.  .  Movement: Present. Denies any leaking of fluid.   The following portions of the patient's history were reviewed and updated as appropriate: allergies, current medications, past family history, past medical history, past social history, past surgical history and problem list.   Objective:   Vitals:   09/27/20 1328  BP: 110/72      Fetal Status:     Movement: Present     General:  Alert, oriented and cooperative. Patient is in no acute distress.  Respiratory: Normal respiratory effort, no problems with respiration noted  Mental Status: Normal mood and affect. Normal behavior. Normal judgment and thought content.  Rest of physical exam deferred due to type of encounter  Imaging: UKoreaMFM OB FOLLOW UP  Result Date: 09/15/2020 ----------------------------------------------------------------------  OBSTETRICS REPORT                        (Signed Final 09/15/2020 04:28 pm) ---------------------------------------------------------------------- Patient Info  ID #:       [id number]                         D.O.B.:  103/01/1986(34 yrs)  Name:       Candice Ward                  Visit Date: 09/15/2020 04:03 pm ---------------------------------------------------------------------- Performed By  Attending:        VJohnell ComingsMD         Ref. Address:     [address]  [place]  Performed By:     Rodrigo Ran BS      [facility]                                                             [place] [facility]                                                             [facility]  Referred By:      Osborne Oman MD ---------------------------------------------------------------------- Orders  #  Description                           Code        Ordered By  1  [place] MFM OB FOLLOW UP                   56387.56    [place] ----------------------------------------------------------------------  #  Order #                     Accession #                Episode #  1  [id number]                   [id number]                 [id number] ---------------------------------------------------------------------- Indications  Poor obstetric history: Previous preterm       O09.219  delivery, antepartum (@28  weeks due to  placental abruption)  [redacted] weeks gestation of pregnancy                Z3A.30  Encounter for other antenatal screening        Z36.2  follow-up ---------------------------------------------------------------------- Vital Signs                                                 Height:        5'3"  ---------------------------------------------------------------------- Fetal Evaluation  Num Of Fetuses:         1  Fetal Heart Rate(bpm):  143  Cardiac Activity:       Observed  Presentation:           Cephalic  Placenta:               Posterior  P. Cord Insertion:      Visualized  Amniotic Fluid  AFI FV:  Within normal limits  AFI Sum(cm)     %Tile       Largest Pocket(cm)  16.8            62          6.1  RUQ(cm)       RLQ(cm)       LUQ(cm)        LLQ(cm)  6.1           1.1           4.6            5 ---------------------------------------------------------------------- Biometry  BPD:      79.1  mm     G. Age:  31w 5d         78  %    CI:        74.85   %    70 - 86                                                          FL/HC:      19.9   %    19.2 - 21.4  HC:      290.1  mm     G. Age:  32w 0d         58  %    HC/AC:      0.99        0.99 - 1.21  AC:      294.2  mm     G. Age:  33w 3d         99  %    FL/BPD:     72.9   %    71 - 87  FL:       57.7  mm     G. Age:  30w 1d         29  %    FL/AC:      19.6   %    20 - 24  HUM:      51.5  mm     G. Age:  30w 1d         46  %  LV:          6  mm  Est. FW:    1920  gm      4 lb 4 oz     91  % ---------------------------------------------------------------------- OB History  Gravidity:    2         Term:   0        Prem:   1  Living:       1 ---------------------------------------------------------------------- Gestational Age  LMP:           30w 3d        Date:  02/15/20                 EDD:   11/21/20  U/S Today:     31w 6d                                        EDD:   11/11/20  Best:          Weston Settle 3d  Det. By:  LMP  (02/15/20)          EDD:   11/21/20 ---------------------------------------------------------------------- Anatomy  Cranium:               Appears normal         Aortic Arch:            Previously seen  Cavum:                 Appears normal         Ductal Arch:            Previously seen  Ventricles:            Appears normal         Diaphragm:               Appears normal  Choroid Plexus:        Appears normal         Stomach:                Appears normal, left                                                                        sided  Cerebellum:            Previously seen        Abdomen:                Appears normal  Posterior Fossa:       Previously seen        Abdominal Wall:         Previously seen  Nuchal Fold:           Previously seen        Cord Vessels:           Appears normal (3                                                                        vessel cord)  Face:                  Orbits and profile     Kidneys:                Appear normal                         previously seen  Lips:                  Previously seen        Bladder:                Appears normal  Thoracic:              Appears normal         Spine:                  Previously seen  Heart:  Previously seen        Upper Extremities:      Previously seen  RVOT:                  Previously seen        Lower Extremities:      Previously seen  LVOT:                  Appears normal  Other:  Heels and 5th digit previously visualized. Fetus appears to be a female.          Open hands previously visualized. Nasal bone visualized. ---------------------------------------------------------------------- Cervix Uterus Adnexa  Cervix  Not visualized (advanced GA >24wks)  Uterus  No abnormality visualized. ---------------------------------------------------------------------- Comments  This patient was seen for a follow up growth scan due to a  history of placental abruption requiring a preterm delivery.  She denies any problems since her last exam.  She was informed that the fetal growth and amniotic fluid  level appears appropriate for her gestational age.  Due to her history, she should continue the weekly Makena  injections until 35 to 36 weeks.  A follow up exam was scheduled in 4 weeks. ----------------------------------------------------------------------                    Johnell Comings, MD Electronically Signed Final Report   09/15/2020 04:28 pm ----------------------------------------------------------------------   Assessment and Plan:  Pregnancy: G2P0101 at 54w0d1. Supervision of high risk pregnancy, antepartum Continue prenatal care  2. Previous preterm delivery, antepartum Makena needs to be refilled--reviewed with nursing staff  3. History of placental abruption No bleeding, normal growth  Preterm labor symptoms and general obstetric precautions including but not limited to vaginal bleeding, contractions, leaking of fluid and fetal movement were reviewed in detail with the patient. I discussed the assessment and treatment plan with the patient. The patient was provided an opportunity to ask questions and all were answered. The patient agreed with the plan and demonstrated an understanding of the instructions. The patient was advised to call back or seek an in-person office evaluation[facility]. Please refer to After Visit Summary for other counseling recommendations.   I provided 8 minutes of face-to-face time during this encounter.  Return in about 2 weeks (around 10/10/2020) for virtual, [place]  Future Appointments  Date Time Provider [place] 10/13/2020  2:45 PM [facility] NURSE [facility]  3:00 PM [facility] US1 [facility] [place] [facility]

## 2020-09-26 NOTE — Telephone Encounter (Addendum)
Late Entry- 09/23/20 @ 1245 Spoke with Kae Heller who informed me that because the prior auth started when patient was 15 wks and that it was prior auth for the vials instead of the sq injection that she has sent a message to her management team to see if they could just change the way that the patient is receiving medication so that the dosage amt can change from 21 to 23.1.  Kae Heller advised me to call the pharmacy at [phone] so that I can request an emergency dose to be sent to patient.  I also provided with the case # [id number].    09/26/20 0815 [facility] at [phone] and then was given the number to patients direct pharmacy [phone].  Contacted pharmacy and spoke with Candace who informed me that the current prior Josem Kaufmann is still valid until November.  Candace informed me that the patient was approved to get 3 doses for 28 days instead of the patient usual dose of 4 for 28.  Candance stated that the patient will get the dose shipped out to her tomorrow.   I informed Candace that this will only get the patient to 35 wks and she still should get another dose.  Candance transferred me to Baxter Flattery, [facility] [employer] assistance program, and I was informed that a test claim on the patient's insurance was implemented and it came back that the patient needs to use the generic form.  I was also informed that the plan's change quarterly so that could be another issue.  Baxter Flattery informed me that a verbal order can be provided to [facility]- pharmacist to get patient the generic hydroxyprogesterone (IM injection).  I informed Morey Hummingbird that I would like to get one more dose of the generic.  Morey Hummingbird verbalized understanding.   L/M for patient that she should receive her dose of Makena, Tuesday 09/27/20, and if she has any questions to please give the office a call.    Mel Almond, RN 09/26/20

## 2020-09-26 NOTE — Patient Instructions (Signed)

## 2020-10-11 ENCOUNTER — Ambulatory Visit: Payer: No Typology Code available for payment source | Admitting: *Deleted

## 2020-10-11 ENCOUNTER — Encounter: Payer: Self-pay | Admitting: *Deleted

## 2020-10-11 ENCOUNTER — Other Ambulatory Visit: Payer: Self-pay

## 2020-10-11 ENCOUNTER — Ambulatory Visit: Payer: No Typology Code available for payment source | Attending: Obstetrics

## 2020-10-11 DIAGNOSIS — O0990 Supervision of high risk pregnancy, unspecified, unspecified trimester: Secondary | ICD-10-CM | POA: Diagnosis present

## 2020-10-11 DIAGNOSIS — O09219 Supervision of pregnancy with history of pre-term labor, unspecified trimester: Secondary | ICD-10-CM | POA: Diagnosis present

## 2020-10-11 DIAGNOSIS — Z362 Encounter for other antenatal screening follow-up: Secondary | ICD-10-CM | POA: Diagnosis present

## 2020-10-11 DIAGNOSIS — Z3A34 34 weeks gestation of pregnancy: Secondary | ICD-10-CM

## 2020-10-11 DIAGNOSIS — O09213 Supervision of pregnancy with history of pre-term labor, third trimester: Secondary | ICD-10-CM

## 2020-10-13 ENCOUNTER — Ambulatory Visit: Payer: No Typology Code available for payment source

## 2020-10-18 ENCOUNTER — Encounter: Payer: Self-pay | Admitting: *Deleted

## 2020-10-19 ENCOUNTER — Encounter: Payer: Self-pay | Admitting: Obstetrics & Gynecology

## 2020-10-19 ENCOUNTER — Ambulatory Visit (INDEPENDENT_AMBULATORY_CARE_PROVIDER_SITE_OTHER): Payer: No Typology Code available for payment source | Admitting: Obstetrics & Gynecology

## 2020-10-19 ENCOUNTER — Other Ambulatory Visit: Payer: Self-pay

## 2020-10-19 ENCOUNTER — Other Ambulatory Visit (HOSPITAL_COMMUNITY)
Admission: RE | Admit: 2020-10-19 | Discharge: 2020-10-19 | Disposition: A | Discharge status: Home / Self Care | Payer: No Typology Code available for payment source | Source: Ambulatory Visit | Attending: Obstetrics & Gynecology | Admitting: Obstetrics & Gynecology

## 2020-10-19 VITALS — Wt 153.9 lb

## 2020-10-19 DIAGNOSIS — O0990 Supervision of high risk pregnancy, unspecified, unspecified trimester: Secondary | ICD-10-CM

## 2020-10-19 DIAGNOSIS — O09219 Supervision of pregnancy with history of pre-term labor, unspecified trimester: Secondary | ICD-10-CM | POA: Diagnosis not present

## 2020-10-19 DIAGNOSIS — Z3A35 35 weeks gestation of pregnancy: Secondary | ICD-10-CM | POA: Diagnosis present

## 2020-10-19 NOTE — Patient Instructions (Signed)
Return to office for any scheduled appointments. Call the office or go to the MAU at [place] at [facility] if:  You begin to have strong, frequent contractions  Your water breaks.  Sometimes it is a big gush of fluid, sometimes it is just a trickle that keeps getting your panties wet or running down your legs  You have vaginal bleeding.  It is normal to have a small amount of spotting if your cervix was checked.   You do not feel your baby moving like normal.  If you do not, get something to eat and drink and lay down and focus on feeling your baby move.   If your baby is still not moving like normal, you should call the office or go to MAU.  Any other obstetric concerns.

## 2020-10-19 NOTE — Progress Notes (Signed)
PRENATAL VISIT NOTE  Subjective:  Candice Ward is a 34 y.o. G2P0101 at 5w2dbeing seen today for ongoing prenatal care.  She is currently monitored for the following issues for this high-risk pregnancy and has Supervision of high risk pregnancy, antepartum; History of placental abruption; Previous preterm delivery, antepartum; Iron deficiency anemia; and Vitamin D deficiency on their problem list.  Patient reports having painful area underneath gravid abdomen.  Reports last Makena injection taken last Tuesday 10/11/20. Unable to get more due to insurance restrictions.  Contractions: Not present. Vag. Bleeding: None.  Movement: Present. Denies leaking of fluid.   The following portions of the patient's history were reviewed and updated as appropriate: allergies, current medications, past family history, past medical history, past social history, past surgical history and problem list.   Objective:   Vitals:   10/19/20 1446  Weight: 153 lb 14.4 oz (69.8 kg)    Fetal Status:     Movement: Present     General:  Alert, oriented and cooperative. Patient is in no acute distress.  Skin: Skin is warm and dry. No rash noted.   Cardiovascular: Normal heart rate noted  Respiratory: Normal respiratory effort, no problems with respiration noted  Abdomen: Soft, gravid, appropriate for gestational age. 3 cm in midline in most dependent part of gravid abdomen with mild swelling and pain.  Pain/Pressure: Present     Pelvic: Pelvic cultures done, chaperone present.  Cervical exam deferred        Extremities: Normal range of motion.  Edema: Deep pitting, indentation remains for a short time  Mental Status: Normal mood and affect. Normal behavior. Normal judgment and thought content.   Imaging: [place] OB FOLLOW [place]  Result Date: 10/11/2020 ----------------------------------------------------------------------  OBSTETRICS REPORT                       (Signed Final 10/11/2020 10:34 am)  ---------------------------------------------------------------------- Patient Info  ID #:       [id number]                         D.O.B.:  106-07-1986(33 yrs)  Name:       Candice Ward                  Visit Date: 10/11/2020 09:14 am ---------------------------------------------------------------------- Performed By  Attending:        RTama HighMD        Ref. Address[address]     [address]  [place]  Performed By:     Claudia Desanctis,      Location[facility]                                                             [place] [facility]                                                             [facility]  Referred By:      Osborne Oman MD ---------------------------------------------------------------------- Orders  #  Description                           Code        Ordered By  1  [place] MFM OB FOLLOW UP                   35329.92    Peterson Ao ----------------------------------------------------------------------  #  Order #                     Accession #                Episode #  1  [id number]                   [id number]                 [id number] ---------------------------------------------------------------------- Indications  Poor obstetric history: Previous preterm       O09.219  delivery, antepartum (@28  weeks due to  placental abruption)  Encounter for other antenatal screening        Z36.2  follow-up  [redacted] weeks gestation of pregnancy                Z3A.34 ---------------------------------------------------------------------- Vital Signs                                                 Height:        5'3" ---------------------------------------------------------------------- Fetal Evaluation  Num Of Fetuses:          1  Cardiac Activity:       Observed  Presentation:           Cephalic  Placenta:               Posterior Fundal  P. Cord Insertion:      Visualized, central  Amniotic Fluid  AFI FV:      Within normal limits  AFI Sum(cm)     %Tile       Largest Pocket(cm)  20.76           78          8.74  RUQ(cm)       RLQ(cm)       LUQ(cm)        LLQ(cm)  3.74          3.32          8.74           4.96 ---------------------------------------------------------------------- Biometry  BPD:      88.8  mm     G. Age:  35w 6d         90  %    CI:        78.33   %    70 - 86                                                          FL/HC:      20.6   %    19.4 - 21.8  HC:      317.4  mm     G. Age:  35w 5d         54  %    HC/AC:      0.95        0.96 - 1.11  AC:      334.3  mm     G. Age:  37w 2d       > 99  %    FL/BPD:     73.5   %    71 - 87  FL:       65.3  mm     G. Age:  33w 5d         28  %    FL/AC:      19.5   %    20 - 24  LV:        6.6  mm  Est. FW:    2850  gm      6 lb 5 oz     93  % ---------------------------------------------------------------------- OB History  Gravidity:    2         Term:   0        Prem:   1  Living:       1 ---------------------------------------------------------------------- Gestational Age  LMP:           34w 1d        Date:  02/15/20                 EDD:   11/21/20  U/S Today:     35w 5d                                        EDD:   11/10/20  Best:          34w 1d     Det. By:  LMP  (02/15/20)          EDD:   11/21/20 ---------------------------------------------------------------------- Anatomy  Cranium:               Appears  normal         Heart:                  Appears normal                                                                        (4CH, axis, and                                                                        situs)  Cavum:                 Appears normal         RVOT:                   Appears normal  Ventricles:            Appears normal         LVOT:                    Appears normal  Choroid Plexus:        Appears normal         Aortic Arch:            Appears normal  Cerebellum:            Appears normal         Ductal Arch:            Appears normal  Posterior Fossa:       Appears normal         Stomach:                Appears normal, left                                                                        sided  Face:                  Appears normal         Kidneys:                Appear normal                         (orbits and profile)  Lips:                  Appears normal         Bladder:                Appears normal  Other:  Ther anatomy previously imaged and appeared normal. ---------------------------------------------------------------------- Impression  Patient returns for fetal growth assessment.  She does not  have gestational diabetes.  History of placental abruption and  previous pregnancy.  She does not give history of vaginal  bleeding in this pregnancy.  On ultrasound, the estimated fetal weight is at the 93rd  percentile.  Amniotic fluid is normal and good fetal activity  seen.  Cephalic presentation.  Placenta appears normal with  no evidence of retroplacental hemorrhage.  I have reassured the couple of the findings.  I explained that  ultrasound has limitations in accurately estimating fetal  weights. ---------------------------------------------------------------------- Recommendations  -Follow-up scans as clinically indicated.  -If bleeding occurs after [redacted] weeks gestation, the patient  should be delivered.  -Patient has vaginal bleeding before [redacted] weeks gestation she  should be admitted for further management. ----------------------------------------------------------------------                  Tama High, MD Electronically Signed Final Report   10/11/2020 10:34 am ----------------------------------------------------------------------   Assessment and Plan:  Pregnancy: G2P0101 at 73w2d1. Previous preterm delivery, antepartum Declined  oral progesterone until next week as not able to get Makena.  PTL precautions advised.   2. [redacted] weeks gestation of pregnancy 3. Supervision of high risk pregnancy, antepartum Pelvic cultures done today as per patient's request, will follow up results and manage accordingly. - Strep Gp B Culture+Rflx - GC/Chlamydia probe amp ([place])[facility].  All other concerns addressed.  Preterm labor symptoms and general obstetric precautions including but not limited to vaginal bleeding, contractions, leaking of fluid and fetal movement were reviewed in detail with the patient. Please refer to After Visit Summary for other counseling recommendations.   Return in about 1 week (around 10/26/2020) for Virtual OB Visit with MD  2 weeks: OFFICE HOB VISIT (MD only).  No future appointments.  UVerita Schneiders MD

## 2020-10-20 LAB — GC/CHLAMYDIA PROBE AMP (~~LOC~~) NOT AT ARMC
Chlamydia: NEGATIVE
Comment: NEGATIVE
Comment: NORMAL
Neisseria Gonorrhea: NEGATIVE

## 2020-10-23 LAB — STREP GP B CULTURE+RFLX: Strep Gp B Culture+Rflx: NEGATIVE

## 2020-10-26 ENCOUNTER — Ambulatory Visit (INDEPENDENT_AMBULATORY_CARE_PROVIDER_SITE_OTHER): Payer: No Typology Code available for payment source | Admitting: Obstetrics and Gynecology

## 2020-10-26 ENCOUNTER — Other Ambulatory Visit: Payer: Self-pay

## 2020-10-26 ENCOUNTER — Telehealth: Payer: Self-pay | Admitting: Lactation Services

## 2020-10-26 VITALS — BP 122/86 | HR 92 | Wt 154.0 lb

## 2020-10-26 DIAGNOSIS — D508 Other iron deficiency anemias: Secondary | ICD-10-CM

## 2020-10-26 DIAGNOSIS — O0990 Supervision of high risk pregnancy, unspecified, unspecified trimester: Secondary | ICD-10-CM

## 2020-10-26 DIAGNOSIS — Z3A36 36 weeks gestation of pregnancy: Secondary | ICD-10-CM

## 2020-10-26 DIAGNOSIS — Z8759 Personal history of other complications of pregnancy, childbirth and the puerperium: Secondary | ICD-10-CM

## 2020-10-26 DIAGNOSIS — O09219 Supervision of pregnancy with history of pre-term labor, unspecified trimester: Secondary | ICD-10-CM

## 2020-10-26 NOTE — Patient Instructions (Signed)

## 2020-10-26 NOTE — Telephone Encounter (Signed)
Called patient in regards to her concerns voiced via my chart.    She is not having pain today,although had some last night. She reports she felt like she wet her pants this morning once and no other time. She has not had any other symptoms today. She has not bleeding, pain or leaking at this time.   Patient asking if there is a female provider that she can see today. Reviewed that she is High Risk and it is preferred she see a female and there is no female MD in the office today.   advised patient that I would recommend she come in today to be checked since she has a history of PTD and that there is not a female provider in the office today.   Patient reports her BP is 124/78 today. Informed her that is normal.    Patient voiced that she is planning to come to the appointment today.

## 2020-10-26 NOTE — Progress Notes (Signed)
   PRENATAL VISIT NOTE  Subjective:  Candice Ward is a 34 y.o. G2P0101 at 29w2dbeing seen today for ongoing prenatal care.  She is currently monitored for the following issues for this high-risk pregnancy and has Supervision of high risk pregnancy, antepartum; History of placental abruption; Previous preterm delivery, antepartum; Iron deficiency anemia; Vitamin D deficiency; and [redacted] weeks gestation of pregnancy on their problem list.  Patient doing well with no acute concerns today. She reports possible leaking of fluid x 1 this AM.  Contractions: Irritability. Vag. Bleeding: None.  Movement: Present.   The following portions of the patient's history were reviewed and updated as appropriate: allergies, current medications, past family history, past medical history, past social history, past surgical history and problem list. Problem list updated.  Objective:   Vitals:   10/26/20 1434  BP: 122/86  Pulse: 92  Weight: 154 lb (69.9 kg)    Fetal Status: Fetal Heart Rate (bpm): 157   Movement: Present     General:  Alert, oriented and cooperative. Patient is in no acute distress.  Skin: Skin is warm and dry. No rash noted.   Cardiovascular: Normal heart rate noted  Respiratory: Normal respiratory effort, no problems with respiration noted  Abdomen: Soft, gravid, appropriate for gestational age.  Pain/Pressure: Present     Pelvic: Cervical exam performed        Extremities: Normal range of motion.  Edema: Mild pitting, slight indentation  Mental Status:  Normal mood and affect. Normal behavior. Normal judgment and thought content.  Due to cultural preferences the ROM exam was performed by KMaye HidesPool: neg Fern: neg Physiologic discharge noted Pt and spouse informed Assessment and Plan:  Pregnancy: G2P0101 at 344w2d1. Supervision of high risk pregnancy, antepartum   2. History of placental abruption Per MFM, delivery if any bleeding after 37 weeks  3. Previous preterm  delivery, antepartum Pt has discontinued 17 progesterone injections  4. Iron deficiency anemia secondary to inadequate dietary iron intake   5. [redacted] weeks gestation of pregnancy   Preterm labor symptoms and general obstetric precautions including but not limited to vaginal bleeding, contractions, leaking of fluid and fetal movement were reviewed in detail with the patient.  Please refer to After Visit Summary for other counseling recommendations.   Return in about 1 week (around 11/02/2020) [facility]   LaLynnda ShieldsMD

## 2020-10-27 ENCOUNTER — Inpatient Hospital Stay (HOSPITAL_COMMUNITY): Payer: No Typology Code available for payment source | Admitting: Anesthesiology

## 2020-10-27 ENCOUNTER — Other Ambulatory Visit: Payer: Self-pay

## 2020-10-27 ENCOUNTER — Inpatient Hospital Stay (HOSPITAL_COMMUNITY)
Admission: AD | Admit: 2020-10-27 | Discharge: 2020-10-30 | DRG: 805 | Disposition: A | Discharge status: Home / Self Care | Payer: No Typology Code available for payment source | Attending: Family Medicine | Admitting: Family Medicine

## 2020-10-27 ENCOUNTER — Encounter (HOSPITAL_COMMUNITY): Payer: Self-pay | Admitting: Obstetrics and Gynecology

## 2020-10-27 DIAGNOSIS — O3660X0 Maternal care for excessive fetal growth, unspecified trimester, not applicable or unspecified: Secondary | ICD-10-CM | POA: Diagnosis present

## 2020-10-27 DIAGNOSIS — O3663X0 Maternal care for excessive fetal growth, third trimester, not applicable or unspecified: Secondary | ICD-10-CM | POA: Diagnosis present

## 2020-10-27 DIAGNOSIS — O411230 Chorioamnionitis, third trimester, not applicable or unspecified: Secondary | ICD-10-CM | POA: Diagnosis present

## 2020-10-27 DIAGNOSIS — Z880 Allergy status to penicillin: Secondary | ICD-10-CM | POA: Diagnosis not present

## 2020-10-27 DIAGNOSIS — O326XX0 Maternal care for compound presentation, not applicable or unspecified: Secondary | ICD-10-CM | POA: Diagnosis not present

## 2020-10-27 DIAGNOSIS — Z3A36 36 weeks gestation of pregnancy: Secondary | ICD-10-CM | POA: Diagnosis not present

## 2020-10-27 DIAGNOSIS — O322XX0 Maternal care for transverse and oblique lie, not applicable or unspecified: Secondary | ICD-10-CM | POA: Diagnosis present

## 2020-10-27 DIAGNOSIS — Z20822 Contact with and (suspected) exposure to covid-19: Secondary | ICD-10-CM | POA: Diagnosis present

## 2020-10-27 DIAGNOSIS — O0990 Supervision of high risk pregnancy, unspecified, unspecified trimester: Secondary | ICD-10-CM

## 2020-10-27 DIAGNOSIS — O42913 Preterm premature rupture of membranes, unspecified as to length of time between rupture and onset of labor, third trimester: Principal | ICD-10-CM | POA: Diagnosis present

## 2020-10-27 DIAGNOSIS — Z8759 Personal history of other complications of pregnancy, childbirth and the puerperium: Secondary | ICD-10-CM

## 2020-10-27 DIAGNOSIS — O09219 Supervision of pregnancy with history of pre-term labor, unspecified trimester: Secondary | ICD-10-CM

## 2020-10-27 DIAGNOSIS — O42919 Preterm premature rupture of membranes, unspecified as to length of time between rupture and onset of labor, unspecified trimester: Secondary | ICD-10-CM | POA: Diagnosis not present

## 2020-10-27 LAB — CBC
HCT: 37.2 % (ref 36.0–46.0)
Hemoglobin: 11.9 g/dL — ABNORMAL LOW (ref 12.0–15.0)
MCH: 28.5 pg (ref 26.0–34.0)
MCHC: 32 g/dL (ref 30.0–36.0)
MCV: 89 fL (ref 80.0–100.0)
Platelets: 257 10*3/uL (ref 150–400)
RBC: 4.18 MIL/uL (ref 3.87–5.11)
RDW: 13.4 % (ref 11.5–15.5)
WBC: 8.3 10*3/uL (ref 4.0–10.5)
nRBC: 0 % (ref 0.0–0.2)

## 2020-10-27 LAB — TYPE AND SCREEN
ABO/RH(D): O POS
Antibody Screen: NEGATIVE

## 2020-10-27 LAB — RESPIRATORY PANEL BY RT PCR (FLU A&B, COVID)
Influenza A by PCR: NEGATIVE
Influenza B by PCR: NEGATIVE
SARS Coronavirus 2 by RT PCR: NEGATIVE

## 2020-10-27 LAB — POCT FERN TEST
POCT Fern Test: NEGATIVE
POCT Fern Test: POSITIVE

## 2020-10-27 MED ORDER — EPHEDRINE 5 MG/ML INJ: 10.0000 mg | INTRAVENOUS | Status: DC | PRN

## 2020-10-27 MED ORDER — LIDOCAINE HCL (PF) 1 % IJ SOLN
INTRAMUSCULAR | Status: DC | PRN
  Administered 2020-10-27: 2 mL via EPIDURAL
  Administered 2020-10-27: 10 mL via EPIDURAL

## 2020-10-27 MED ORDER — MISOPROSTOL 50MCG HALF TABLET
ORAL_TABLET | ORAL | Status: AC
  Filled 2020-10-27: qty 1

## 2020-10-27 MED ORDER — DIPHENHYDRAMINE HCL 50 MG/ML IJ SOLN: 12.5000 mg | INTRAMUSCULAR | Status: DC | PRN

## 2020-10-27 MED ORDER — OXYTOCIN BOLUS FROM INFUSION
333.0000 mL | Freq: Once | INTRAVENOUS | Status: AC
  Administered 2020-10-28: 333 mL via INTRAVENOUS

## 2020-10-27 MED ORDER — TERBUTALINE SULFATE 1 MG/ML IJ SOLN: 0.2500 mg | Freq: Once | INTRAMUSCULAR | Status: DC | PRN

## 2020-10-27 MED ORDER — FENTANYL-BUPIVACAINE-NACL 0.5-0.125-0.9 MG/250ML-% EP SOLN: 12.0000 mL/h | EPIDURAL | Status: DC | PRN

## 2020-10-27 MED ORDER — PHENYLEPHRINE 40 MCG/ML (10ML) SYRINGE FOR IV PUSH (FOR BLOOD PRESSURE SUPPORT)
PREFILLED_SYRINGE | INTRAVENOUS | Status: AC
  Filled 2020-10-27: qty 10

## 2020-10-27 MED ORDER — OXYCODONE-ACETAMINOPHEN 5-325 MG PO TABS: 2.0000 | ORAL_TABLET | ORAL | Status: DC | PRN

## 2020-10-27 MED ORDER — PHENYLEPHRINE 40 MCG/ML (10ML) SYRINGE FOR IV PUSH (FOR BLOOD PRESSURE SUPPORT): 80.0000 ug | PREFILLED_SYRINGE | INTRAVENOUS | Status: DC | PRN

## 2020-10-27 MED ORDER — LACTATED RINGERS IV SOLN: 500.0000 mL | Freq: Once | INTRAVENOUS | Status: DC

## 2020-10-27 MED ORDER — LACTATED RINGERS IV SOLN: INTRAVENOUS | Status: DC

## 2020-10-27 MED ORDER — SOD CITRATE-CITRIC ACID 500-334 MG/5ML PO SOLN: 30.0000 mL | ORAL | Status: DC | PRN

## 2020-10-27 MED ORDER — ACETAMINOPHEN 325 MG PO TABS
650.0000 mg | ORAL_TABLET | ORAL | Status: DC | PRN
  Administered 2020-10-27 – 2020-10-28 (×2): 650 mg via ORAL
  Filled 2020-10-27 (×2): qty 2

## 2020-10-27 MED ORDER — FENTANYL-BUPIVACAINE-NACL 0.5-0.125-0.9 MG/250ML-% EP SOLN
EPIDURAL | Status: AC
  Filled 2020-10-27: qty 250

## 2020-10-27 MED ORDER — LIDOCAINE HCL (PF) 1 % IJ SOLN
30.0000 mL | INTRAMUSCULAR | Status: DC | PRN
  Filled 2020-10-27: qty 30

## 2020-10-27 MED ORDER — LACTATED RINGERS IV BOLUS
1000.0000 mL | Freq: Once | INTRAVENOUS | Status: AC
  Administered 2020-10-27: 1000 mL via INTRAVENOUS

## 2020-10-27 MED ORDER — ONDANSETRON HCL 4 MG/2ML IJ SOLN: 4.0000 mg | Freq: Four times a day (QID) | INTRAMUSCULAR | Status: DC | PRN

## 2020-10-27 MED ORDER — PHENYLEPHRINE 40 MCG/ML (10ML) SYRINGE FOR IV PUSH (FOR BLOOD PRESSURE SUPPORT)
80.0000 ug | PREFILLED_SYRINGE | INTRAVENOUS | Status: DC | PRN
  Filled 2020-10-27: qty 10

## 2020-10-27 MED ORDER — OXYTOCIN-SODIUM CHLORIDE 30-0.9 UT/500ML-% IV SOLN: 2.5000 [IU]/h | INTRAVENOUS | Status: DC

## 2020-10-27 MED ORDER — SODIUM CHLORIDE (PF) 0.9 % IJ SOLN
INTRAMUSCULAR | Status: DC | PRN
  Administered 2020-10-27: 12 mL/h via EPIDURAL

## 2020-10-27 MED ORDER — BETAMETHASONE SOD PHOS & ACET 6 (3-3) MG/ML IJ SUSP
12.0000 mg | INTRAMUSCULAR | Status: AC
  Administered 2020-10-27 – 2020-10-28 (×2): 12 mg via INTRAMUSCULAR
  Filled 2020-10-27: qty 5

## 2020-10-27 MED ORDER — OXYTOCIN-SODIUM CHLORIDE 30-0.9 UT/500ML-% IV SOLN
1.0000 m[IU]/min | INTRAVENOUS | Status: DC
  Administered 2020-10-27: 2 m[IU]/min via INTRAVENOUS
  Filled 2020-10-27: qty 500

## 2020-10-27 MED ORDER — FENTANYL CITRATE (PF) 100 MCG/2ML IJ SOLN: 50.0000 ug | INTRAMUSCULAR | Status: DC | PRN

## 2020-10-27 MED ORDER — LACTATED RINGERS AMNIOINFUSION
INTRAVENOUS | Status: DC
  Administered 2020-10-27: 1 mL via INTRAUTERINE

## 2020-10-27 MED ORDER — LACTATED RINGERS IV SOLN: 500.0000 mL | INTRAVENOUS | Status: DC | PRN

## 2020-10-27 MED ORDER — OXYCODONE-ACETAMINOPHEN 5-325 MG PO TABS: 1.0000 | ORAL_TABLET | ORAL | Status: DC | PRN

## 2020-10-27 MED ORDER — MISOPROSTOL 50MCG HALF TABLET
50.0000 ug | ORAL_TABLET | ORAL | Status: DC
  Administered 2020-10-27: 50 ug via ORAL

## 2020-10-27 NOTE — Progress Notes (Signed)
Labor Progress Note Candice Ward is a 34 y.o. G2P0101 at 24w3dpresented for PPROM (0830)/latent labor.  S: Does endorse mild headache, no visual changes or RUQ pain.   O:  BP 118/65   Pulse 78   Temp 98.2 F (36.8 C) (Oral)   Resp 16   Ht 5' 3"  (1.6 m)   Wt 70.8 kg   LMP 02/15/2020 (Exact Date)   BMI 27.63 kg/m  EFM: baseline 130bpm/mod variability/+ accels/no decels Toco: q3-5 minutes  CVE: Dilation: 4 Effacement (%): 50 Station: -3 Presentation: Vertex (by bedside ultrasound) Exam by:: Dr. FKathrynn Ducking  A&P: 34y.o. [id number] for PPROM (0830)/latent labor.  #Labor: Placed IUPC 2/2 to minimal change over last 2 hours despite frequent contractions on monitor. Will start low dose pitocin depending on power of contractions.  -Is endorsing mild headache but BP stable. Will give tylenol.  #Pain: PRN #FWB: cat 2, intermittent variables that resolve with repositioning. Overall reassuring.  #GBS negative #contraception: undecided on epidural, if patient elects for epidural will place post placental Liletta, if not will place outpatient.   CaLayla BarterMD PGY-1 4:49 PM

## 2020-10-27 NOTE — Progress Notes (Signed)
Labor Progress Note Candice Ward is a 34 y.o. G2P0101 at 90w3dpresented for PPROM (0830)/latent labor.  S: Doing well without complaints.  O:  BP 111/63    Pulse 74    Temp 98.2 F (36.8 C) (Oral)    Resp 16    Ht 5' 3"  (1.6 m)    Wt 70.8 kg    LMP 02/15/2020 (Exact Date)    BMI 27.63 kg/m  EFM: baseline 130bpm/mod variability/+ accels/no decels Toco: q3-5 minutes  CVE: Dilation: 3 Effacement (%): Thick Station: Ballotable Presentation: Vertex (by bedside ultrasound) Exam by:: D. Kiger RN   A&P: 34y.o. [id number] for PPROM (0830)/latent labor. #Labor: Progressing well. Continue expectant management. Will initiate pitocin if contractions space out. #Pain: PRN #FWB: cat 1 #GBS negative #contraception: undecided on epidural, if patient elects for epidural will place post placental Liletta, if not will place outpatient.  AlArrie SenateMD 2:46 PM

## 2020-10-27 NOTE — Progress Notes (Signed)
Labor Progress Note Candice Ward is a 34 y.o. G2P0101 at 8w3dpresented for PPROM (0830)/latent labor.  S: Reported to bedside for prolonged decel.  O:  BP 118/65   Pulse 78   Temp 98.2 F (36.8 C) (Oral)   Resp 16   Ht 5' 3"  (1.6 m)   Wt 70.8 kg   LMP 02/15/2020 (Exact Date)   SpO2 96%   BMI 27.63 kg/m  EFM: baseline 140 bpm / mod variability/intermittent late and variable decels with contractions. IUPC: q 3-5 minutes, <2058ms  CVE: Dilation: 3.5 Effacement (%): 50 Station: -2 Presentation: Vertex (by bedside ultrasound) Exam by:: [name] A&P: [age].o. [id number] for PPROM (0830)/latent labor.  #Labor: IUPC in place, most recently inadequate contractions. Cervical exam has been unchanged, unable to start pitocin at this time given FHT. Foley catheter placed given prolonged decels occur with ambulation to restroom. Will continue to monitor and start pitocin when able. If FHT continues to demonstrate late and variable decels patient and husband counseled on necessity of cesarean section.  The risks of cesarean section were discussed with the patient including but were not limited to: bleeding which may require transfusion or reoperation; infection which may require antibiotics; injury to bowel, bladder, ureters or other surrounding organs; injury to the fetus; need for additional procedures including hysterectomy in the event of a life-threatening hemorrhage; placental abnormalities wth subsequent pregnancies, incisional problems, thromboembolic phenomenon and other postoperative/anesthesia complications. The patient concurred with the proposed plan, giving informed written consent for the procedures.    #Pain: epidural PRN #FWB: cat 2 #GBS negative #contraception: undecided on epidural, if patient elects for epidural will place post placental Liletta, if not will place outpatient.   AliArrie SenateD   7:28 PM

## 2020-10-27 NOTE — MAU Provider Note (Signed)
History   [id number]   Chief Complaint  Patient presents with  . Labor Eval    HPI Candice Ward is a 34 y.o. female  G2P0101 @36 .3 wks here with report of leaking of fluid after being evaluated in MAU for labor.  Leaking of fluid has continued. Pt reports regular contractions. She denies vaginal bleeding. She reports good fetal movement. All other systems negative.    Patient's last menstrual period was 02/15/2020 (exact date).  OB History  Gravida Para Term Preterm AB Living  2 1   1   1   SAB TAB Ectopic Multiple Live Births          1    # Outcome Date GA Lbr Len/2nd Weight Sex Delivery Anes PTL Lv  2 Current           1 Preterm 2017 [redacted]w[redacted]d   Vag-Spont   LIV     Complications: Abruptio Placenta    Past Medical History:  Diagnosis Date  . Anemia   . Celiac disease   . 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 No pertinent family history.  Social History   Socioeconomic History  . Marital status: Married    Spouse name: Not on file  . Number of children: Not on file  . Years of education: Not on file  . Highest education level: Not on file  Occupational History  . Not on file  Tobacco Use  . Smoking status: Never Smoker  . Smokeless tobacco: Never Used  Vaping Use  . Vaping Use: Never used  Substance and Sexual Activity  . Alcohol use: No  . Drug use: No  . Sexual activity: Yes    Birth control/protection: None  Other Topics Concern  . Not on file  Social History Narrative  . Not on file   Social Determinants of Health   Financial Resource Strain:   . Difficulty of Paying Living Expenses: Not on file  Food Insecurity: No Food Insecurity  . Worried About RCharity fundraiserin the Last Year: Never true  . Ran Out of Food in the Last Year: Never true  Transportation Needs: No Transportation Needs  . Lack of Transportation (Medical): No  . Lack of Transportation (Non-Medical): No  Physical Activity:   . Days of  Exercise per Week: Not on file  . Minutes of Exercise per Session: Not on file  Stress:   . Feeling of Stress : Not on file  Social Connections:   . Frequency of Communication with Friends and Family: Not on file  . Frequency of Social Gatherings with Friends and Family: Not on file  . Attends Religious Services: Not on file  . Active Member of Clubs or Organizations: Not on file  . Attends CArchivistMeetings: Not on file  . Marital Status: Not on file    Allergies  Allergen Reactions  . Amoxicillin Other (See Comments)    Childhood allergy, suffocating feeling, rash also     No current facility-administered medications on file prior to encounter.   Current Outpatient Medications on File Prior to Encounter  Medication Sig Dispense Refill  . Prenatal Vit-Fe Fumarate-FA (MULTIVITAMIN-PRENATAL) 27-0.8 MG TABS tablet Take 1 tablet by mouth daily at 12 noon.       Review of Systems  Gastrointestinal: Positive for abdominal pain.  Genitourinary: Positive for vaginal discharge.     Physical Exam   Vitals:   10/27/20 0729  BP: 108/76  Pulse: 92  Resp: 18  Temp: 98.7 F (37.1 C)  Weight: 70.8 kg  Height: 5' 3"  (1.6 m)    Physical Exam Vitals and nursing note reviewed. Exam conducted with a chaperone present.  Constitutional:      Appearance: Normal appearance.  HENT:     Head: Normocephalic and atraumatic.  Cardiovascular:     Rate and Rhythm: Normal rate.  Pulmonary:     Effort: Pulmonary effort is normal. No respiratory distress.  Genitourinary:    Comments: SSE: +pool, fern pos Musculoskeletal:        General: Normal range of motion.     Cervical back: Normal range of motion.  Skin:    General: Skin is warm and dry.  Neurological:     General: No focal deficit present.     Mental Status: She is alert and oriented to person, place, and time.  Psychiatric:        Mood and Affect: Mood normal.        Behavior: Behavior normal.   EFM: 150 bpm,  mod variability, + accels, no decels Toco: 2-5  MAU Course  Procedures  MDM SROM confirmed. Plan for admit.  Assessment and Plan  [redacted] weeks gestation Reactive NST SROM Admit to LD Mngt per labor team   Julianne Handler, CNM 10/27/2020 9:44 AM

## 2020-10-27 NOTE — Anesthesia Procedure Notes (Signed)
Epidural Patient location during procedure: OB Start time: 10/27/2020 8:12 PM End time: 10/27/2020 8:24 PM  Staffing Anesthesiologist: Pervis Hocking, DO Performed: anesthesiologist   Preanesthetic Checklist Completed: patient identified, IV checked, risks and benefits discussed, monitors and equipment checked, pre-op evaluation and timeout performed  Epidural Patient position: sitting Prep: DuraPrep and site prepped and draped Patient monitoring: continuous pulse ox, blood pressure, heart rate and cardiac monitor Approach: midline Location: L3-L4 Injection technique: LOR air  Needle:  Needle type: Tuohy  Needle gauge: 17 G Needle length: 9 cm Needle insertion depth: 6 cm Catheter type: closed end flexible Catheter size: 19 Gauge Catheter at skin depth: 11 cm Test dose: negative  Assessment Sensory level: T8 Events: blood not aspirated, injection not painful, no injection resistance, no paresthesia and negative IV test  Additional Notes Patient identified. Risks/Benefits/Options discussed with patient including but not limited to bleeding, infection, nerve damage, paralysis, failed block, incomplete pain control, headache, blood pressure changes, nausea, vomiting, reactions to medication both or allergic, itching and postpartum back pain. Confirmed with bedside nurse the patient's most recent platelet count. Confirmed with patient that they are not currently taking any anticoagulation, have any bleeding history or any family history of bleeding disorders. Patient expressed understanding and wished to proceed. All questions were answered. Sterile technique was used throughout the entire procedure. Please see nursing notes for vital signs. Test dose was given through epidural catheter and negative prior to continuing to dose epidural or start infusion. Warning signs of high block given to the patient including shortness of breath, tingling/numbness in hands, complete motor  block, or any concerning symptoms with instructions to call for help. Patient was given instructions on fall risk and not to get out of bed. All questions and concerns addressed with instructions to call with any issues or inadequate analgesia.  Reason for block:procedure for pain

## 2020-10-27 NOTE — MAU Note (Signed)
Pt complains of leaking of clear fluid. MAU provider Colman Cater notified and additional fern slide prepared.

## 2020-10-27 NOTE — H&P (Addendum)
OBSTETRIC ADMISSION HISTORY AND PHYSICAL  Candice Ward is a 34 y.o. female G50P0101 with IUP at 44w3dby LMP c/w 11 week ultrasound presenting for PPROM (today 0830). She reports +FMs, no VB, no blurry vision, headaches or peripheral edema, and RUQ pain.  She plans on breast feeding. She is considering IUD for birth control. [facility]  Dating: By LMP c/w 11 week u/s --->  Estimated Date of Delivery: 11/21/20  Sono:   10/11/20 @[redacted]w[redacted]d , CWD, normal anatomy, cephalic presentation, 23086V 93% EFW  Prenatal History/Complications:  History of preterm Delivery (28 wks,on 17 hp weekly this pregnancy) History of Placental Abruption prior pregnancy  LGA (93% on 10/11/20)  Past Medical History: Past Medical History:  Diagnosis Date  . Anemia   . Celiac disease   . Papilloma of left breast 01/22/2017   Excision 2015    Past Surgical History: Past Surgical History:  Procedure Laterality Date  . BREAST DUCTAL SYSTEM EXCISION Left 10/05/2014   Procedure: LEFT NIPPLE DUCT EXCISION;  Surgeon: MDonnie Mesa MD;  [facility]  Service: General;  Laterality: Left;  . LASIK  2009    Obstetrical History: OB History    Gravida  2   Para  1   Term      Preterm  1   AB      Living  1     SAB      TAB      Ectopic      Multiple      Live Births  1          Social History Social History   Socioeconomic History  . Marital status: Married    Spouse name: Not on file  . Number of children: Not on file  . Years of education: Not on file  . Highest education level: Not on file  Occupational History  . Not on file  Tobacco Use  . Smoking status: Never Smoker  . Smokeless tobacco: Never Used  Vaping Use  . Vaping Use: Never used  Substance and Sexual Activity  . Alcohol use: No  . Drug use: No  . Sexual activity: Yes    Birth control/protection: None  Other Topics Concern  . Not on file  Social History Narrative  . Not on file    Social Determinants of Health   Financial Resource Strain:   . Difficulty of Paying Living Expenses: Not on file  Food Insecurity: No Food Insecurity  . Worried About RCharity fundraiserin the Last Year: Never true  . Ran Out of Food in the Last Year: Never true  Transportation Needs: No Transportation Needs  . Lack of Transportation (Medical): No  . Lack of Transportation (Non-Medical): No  Physical Activity:   . Days of Exercise per Week: Not on file  . Minutes of Exercise per Session: Not on file  Stress:   . Feeling of Stress : Not on file  Social Connections:   . Frequency of Communication with Friends and Family: Not on file  . Frequency of Social Gatherings with Friends and Family: Not on file  . Attends Religious Services: Not on file  . Active Member of Clubs or Organizations: Not on file  . Attends CArchivistMeetings: Not on file  . Marital Status: Not on file    Family History: History reviewed. No pertinent family history.  Allergies: Allergies  Allergen Reactions  . Amoxicillin Other (See Comments)  Childhood allergy, suffocating feeling, rash also     Medications Prior to Admission  Medication Sig Dispense Refill Last Dose  . Prenatal Vit-Fe Fumarate-FA (MULTIVITAMIN-PRENATAL) 27-0.8 MG TABS tablet Take 1 tablet by mouth daily at 12 noon.   10/26/2020 at Unknown time   Review of Systems  All systems reviewed and negative except as stated in HPI  Blood pressure 108/76, pulse 92, temperature 98.2 F (36.8 C), temperature source Oral, resp. rate 16, height 5' 3"  (1.6 m), weight 70.8 kg, last menstrual period 02/15/2020. General appearance: alert, cooperative and no distress Lungs: normal respiratory effort Heart: regular rate and rhythm Abdomen: soft, non-tender, gravid Pelvic: as noted below Extremities: Homans sign is negative, no sign of DVT Presentation: cephalic per bedside ultrasound Fetal monitoringBaseline: 140 bpm, Variability:  Good {> 6 bpm), Accelerations: Reactive and Decelerations: Absent Uterine activityFrequency: Every 3-4 minutes Dilation: 3 Effacement (%): Thick Station: Ballotable Exam by:: Gus Height RN  Prenatal labs: ABO, Rh: --/--/O POS (11/18 0845) Antibody: NEG (11/18 0845) Rubella: 29.00 (05/13 0925) RPR: Non Reactive (10/01 0841)  HBsAg: Negative (05/13 0925)  HIV: Non Reactive (10/01 0841)  GBS: Negative/-- (11/10 1528)  2 hr Glucola passed Genetic screening  normal Anatomy US LGA otherwise unremarkable  Prenatal Transfer Tool  Maternal Diabetes: No Genetic Screening: Normal Maternal Ultrasounds/Referrals: Normal Fetal Ultrasounds or other Referrals:  None Maternal Substance Abuse:  No Significant Maternal Medications:  None Significant Maternal Lab Results: Group B Strep negative  Results for orders placed or performed during the hospital encounter of 10/27/20 (from the past 24 hour(s))  CBC   Collection Time: 10/27/20  8:37 AM  Result Value Ref Range   WBC 8.3 4.0 - 10.5 K/uL   RBC 4.18 3.87 - 5.11 MIL/uL   Hemoglobin 11.9 (L) 12.0 - 15.0 g/dL   HCT 37.2 36 - 46 %   MCV 89.0 80.0 - 100.0 fL   MCH 28.5 26.0 - 34.0 pg   MCHC 32.0 30.0 - 36.0 g/dL   RDW 13.4 11.5 - 15.5 %   Platelets 257 150 - 400 K/uL   nRBC 0.0 0.0 - 0.2 %  Type and screen [place]   Collection Time: 10/27/20  8:45 AM  Result Value Ref Range   ABO/RH(D) O POS    Antibody Screen NEG    Sample Expiration      10/30/2020,2359 Performed at [facility], [address], [place],  [address]   Maryann Alar Test   Collection Time: 10/27/20  8:56 AM  Result Value Ref Range   POCT Fern Test Negative = intact amniotic membranes   Fern Test   Collection Time: 10/27/20  9:48 AM  Result Value Ref Range   POCT Fern Test Positive = ruptured amniotic membanes   Respiratory Panel by RT PCR (Flu A&B, Covid) - Nasopharyngeal Swab   Collection Time: 10/27/20  9:49 AM   Specimen:  Nasopharyngeal Swab; Nasopharyngeal(NP) swabs in vial transport medium  Result Value Ref Range   SARS Coronavirus 2 by RT PCR NEGATIVE NEGATIVE   Influenza A by PCR NEGATIVE NEGATIVE   Influenza B by PCR NEGATIVE NEGATIVE    Patient Active Problem List   Diagnosis Date Noted  . Preterm premature rupture of membranes (PPROM) with unknown onset of labor 10/27/2020  . LGA (large for gestational age) fetus affecting management of mother 10/27/2020  . Supervision of high risk pregnancy, antepartum 03/22/2020  . History of placental abruption 03/22/2020  . Previous preterm delivery,  antepartum 03/22/2020    Assessment/Plan:  Candice Ward is a 34 y.o. G2P0101 at 45w3dhere for PPROM (0830 on 11/18) and early latent labor.  #PPROM/latent labor. Presented to MAU at 0.5cm and PPROM while in MAU (0830). Upon presentation to L&D she progressed to 3cm without any intervention. Expectant management at this time.  #LGA: s/d precautions at delivery  #Pain: prn IV pain meds #FWB: FHT cat 1, +accels and no decels #ID: GBS negative #MOF: Breast #MOC: IUD outpatient #Circ: Yes  CLayla Barter MD  10/27/2020, 1:37 PM

## 2020-10-27 NOTE — Anesthesia Preprocedure Evaluation (Signed)
Anesthesia Evaluation    Reviewed: Allergy & Precautions, Patient's Chart, lab work & pertinent test results  Airway Mallampati: II  TM Distance: >3 FB Neck ROM: Full    Dental no notable dental hx.    Pulmonary neg pulmonary ROS,    Pulmonary exam normal breath sounds clear to auscultation       Cardiovascular negative cardio ROS Normal cardiovascular exam Rhythm:Regular Rate:Normal     Neuro/Psych negative neurological ROS  negative psych ROS   GI/Hepatic negative GI ROS, Neg liver ROS,   Endo/Other  negative endocrine ROS  Renal/GU negative Renal ROS  negative genitourinary   Musculoskeletal negative musculoskeletal ROS (+)   Abdominal   Peds  Hematology negative hematology ROS (+) hct 37.2, plt 257   Anesthesia Other Findings   Reproductive/Obstetrics (+) Pregnancy G2P1 no prior epidural  Abruption with prior delivery, possible abruption currently                              Anesthesia Physical Anesthesia Plan  ASA: II and emergent  Anesthesia Plan: Epidural   Post-op Pain Management:    Induction:   PONV Risk Score and Plan: Treatment may vary due to age or medical condition  Airway Management Planned: Natural Airway  Additional Equipment: None  Intra-op Plan:   Post-operative Plan:   Informed Consent: I have reviewed the patients History and Physical, chart, labs and discussed the procedure including the risks, benefits and alternatives for the proposed anesthesia with the patient or authorized representative who has indicated his/her understanding and acceptance.       Plan Discussed with:   Anesthesia Plan Comments:         Anesthesia Quick Evaluation

## 2020-10-27 NOTE — Progress Notes (Signed)
Labor Progress Note Candice Ward is a 34 y.o. G2P0101 at 71w3dpresented for PPROM (0830)/latent labor.  S: There was notable fetal late decelerations x4 contractions, went to assess patient. Patient had just returned from using the bathroom at initial late decel. Patient also noted bright red blood per vagina trickling down leg.   O:  BP 118/65   Pulse 78   Temp 98.2 F (36.8 C) (Oral)   Resp 16   Ht 5' 3"  (1.6 m)   Wt 70.8 kg   LMP 02/15/2020 (Exact Date)   BMI 27.63 kg/m  EFM: baseline 130 bpm / mod variability/+ accels followed by 4 recurrent late decels at 17:54 that resolved after position change.  Currently 130bpm, mod var, +accels, no decels  IUPC: q 3-5 minutes, <2080ms  CVE: Dilation: 3.5 Effacement (%): 50 Station: -2 Presentation: Vertex (by bedside ultrasound) Exam by:: Dr. MuVanetta Shawlotable 50cc dark red clot at time of SVE with bright red blood on patient's leg.    A&P: [age].o. [id number] for PPROM (0830)/latent labor.  #Labor: Patient and husband refused placement of FSE at this time. Unable to begin pitocin 2/2 category II strip with late decels. Will continue to monitor and when category I tracing without further variables or lates, will begin pitocin.   #Pain: epidural PRN #FWB: cat 2, recurrent late decels x4 that resolved. Overall reassuring category I now.  #GBS negative #contraception: undecided on epidural, if patient elects for epidural will place post placental Liletta, if not will place outpatient.   EliKatherine BassetO   6:53 PM

## 2020-10-27 NOTE — MAU Note (Signed)
Pt transported to Labor and Delivery via wheelchair in stable condition.  Handoff report provided.

## 2020-10-27 NOTE — Progress Notes (Signed)
Labor Progress Note Candice Ward is a 34 y.o. G2P0101 at 71w3dpresented for PPROM (0830), in latent labor.  S: Pt reports feeling more comfortable with epidural in place. No concerns at this time.  O:  BP (!) 105/54   Pulse (!) 104   Temp 99 F (37.2 C) (Oral)   Resp 16   Ht 5' 3"  (1.6 m)   Wt 70.8 kg   LMP 02/15/2020 (Exact Date)   SpO2 99%   BMI 27.63 kg/m  EFM: baseline 120 bpm / mod variability/+accels/intermittent variable decels IUPC: q 3-5 minutes, <2045ms   CVE: Dilation: 4.5 Effacement (%): 60 Station: -2 Presentation: Vertex Exam by:: C.Ignacia FellingRNC   A&P: [age].o. G2P0101 3655w3desented for PPROM (0830), in latent labor.  #Labor s/p PPROM: S/p BMZ at 1130 on 11/18. IUPC in place, most recently inadequate contractions. Throughout the day unable to initiate pitocin given prolonged decels with ambulation to restroom. Foley cath now in place. Given improvement in FHT with amnioinfusion (initiated at 1745) and inadequate contractions by IUPC, pitocin initiated at 2200. Will continue to monitor closely and recheck cervical exam 4 hours s/p initiation of pitocin or sooner as clinically indicated. On arrival to L&D this evening, author had extensive conversation with pt and her husband regarding plan of care. Pt previously consented for possible Cesarean delivery with Dr. FirSylvester Harderee last labor progress note).   #Pain: epidural in place #FWB: cat 2 strip given intermittent variables but reassuring pt with cervical change on last check and moderate variability throughout. In addition, +accels and good recovery between decels. Overall improved with amnioinfusion. #GBS negative #Contraception: undecided on epidural, if patient elects for epidural will place post placental Liletta, if not will place outpatient.   #H/o Preterm Abruption: pt with several quarter-sized clots at time of last cervical exam per nursing. Will continue to monitor for worsening bleeding and signs of  fetal intolerance on FHT.  AnnRanda NgoD   10:12 PM

## 2020-10-27 NOTE — MAU Note (Signed)
Pt reports she has had contrations since 2am. About 5 min apart . Good fetal movement felt. Denies any vag bleeding or leaking at this time.

## 2020-10-28 ENCOUNTER — Encounter (HOSPITAL_COMMUNITY): Payer: Self-pay | Admitting: Obstetrics & Gynecology

## 2020-10-28 DIAGNOSIS — Z3A36 36 weeks gestation of pregnancy: Secondary | ICD-10-CM

## 2020-10-28 DIAGNOSIS — O326XX0 Maternal care for compound presentation, not applicable or unspecified: Secondary | ICD-10-CM

## 2020-10-28 LAB — RPR: RPR Ser Ql: NONREACTIVE

## 2020-10-28 MED ORDER — DIPHENHYDRAMINE HCL 25 MG PO CAPS: 25.0000 mg | ORAL_CAPSULE | Freq: Four times a day (QID) | ORAL | Status: DC | PRN

## 2020-10-28 MED ORDER — PARAGARD INTRAUTERINE COPPER IU IUD
INTRAUTERINE_SYSTEM | Freq: Once | INTRAUTERINE | Status: DC
  Filled 2020-10-28: qty 1

## 2020-10-28 MED ORDER — IBUPROFEN 600 MG PO TABS
600.0000 mg | ORAL_TABLET | Freq: Four times a day (QID) | ORAL | Status: DC
  Administered 2020-10-28 – 2020-10-30 (×7): 600 mg via ORAL
  Filled 2020-10-28 (×8): qty 1

## 2020-10-28 MED ORDER — ONDANSETRON HCL 4 MG/2ML IJ SOLN: 4.0000 mg | INTRAMUSCULAR | Status: DC | PRN

## 2020-10-28 MED ORDER — WITCH HAZEL-GLYCERIN EX PADS: 1.0000 "application " | MEDICATED_PAD | CUTANEOUS | Status: DC | PRN

## 2020-10-28 MED ORDER — COCONUT OIL OIL: 1.0000 "application " | TOPICAL_OIL | Status: DC | PRN

## 2020-10-28 MED ORDER — PIPERACILLIN-TAZOBACTAM 3.375 G IVPB 30 MIN
3.3750 g | Freq: Four times a day (QID) | INTRAVENOUS | Status: DC
  Administered 2020-10-28: 3.375 g via INTRAVENOUS
  Filled 2020-10-28 (×2): qty 50

## 2020-10-28 MED ORDER — CEFAZOLIN SODIUM-DEXTROSE 2-4 GM/100ML-% IV SOLN
2.0000 g | Freq: Once | INTRAVENOUS | Status: DC
  Administered 2020-10-28: 2 g via INTRAVENOUS
  Filled 2020-10-28: qty 100

## 2020-10-28 MED ORDER — CEFAZOLIN SODIUM 1 G IJ SOLR
1.0000 g | Freq: Once | INTRAMUSCULAR | Status: DC
  Filled 2020-10-28: qty 10

## 2020-10-28 MED ORDER — ACETAMINOPHEN 325 MG PO TABS
650.0000 mg | ORAL_TABLET | ORAL | Status: DC | PRN
  Administered 2020-10-28 – 2020-10-29 (×3): 650 mg via ORAL
  Filled 2020-10-28 (×3): qty 2

## 2020-10-28 MED ORDER — SIMETHICONE 80 MG PO CHEW: 80.0000 mg | CHEWABLE_TABLET | ORAL | Status: DC | PRN

## 2020-10-28 MED ORDER — ONDANSETRON HCL 4 MG PO TABS: 4.0000 mg | ORAL_TABLET | ORAL | Status: DC | PRN

## 2020-10-28 MED ORDER — DOCUSATE SODIUM 100 MG PO CAPS
100.0000 mg | ORAL_CAPSULE | Freq: Two times a day (BID) | ORAL | Status: DC
  Administered 2020-10-28 – 2020-10-29 (×3): 100 mg via ORAL
  Filled 2020-10-28 (×4): qty 1

## 2020-10-28 MED ORDER — GENTAMICIN SULFATE 40 MG/ML IJ SOLN
5.0000 mg/kg | Freq: Once | INTRAVENOUS | Status: DC
  Filled 2020-10-28: qty 7.5

## 2020-10-28 MED ORDER — SENNOSIDES-DOCUSATE SODIUM 8.6-50 MG PO TABS
2.0000 | ORAL_TABLET | ORAL | Status: DC
  Administered 2020-10-29: 2 via ORAL
  Filled 2020-10-28 (×2): qty 2

## 2020-10-28 MED ORDER — PRENATAL MULTIVITAMIN CH
1.0000 | ORAL_TABLET | Freq: Every day | ORAL | Status: DC
  Administered 2020-10-29 – 2020-10-30 (×2): 1 via ORAL
  Filled 2020-10-28 (×3): qty 1

## 2020-10-28 MED ORDER — DIBUCAINE (PERIANAL) 1 % EX OINT: 1.0000 "application " | TOPICAL_OINTMENT | CUTANEOUS | Status: DC | PRN

## 2020-10-28 MED ORDER — BENZOCAINE-MENTHOL 20-0.5 % EX AERO
1.0000 "application " | INHALATION_SPRAY | CUTANEOUS | Status: DC | PRN
  Filled 2020-10-28: qty 56

## 2020-10-28 NOTE — Progress Notes (Addendum)
**NOTE: gentamicin was discontinued before it could be started, and zosyn was ordered.   ANTIBIOTIC CONSULT NOTE - INITIAL  Pharmacy Consult for Gentamicin Indication: Chorioamnionitis   Allergies  Allergen Reactions  . Amoxicillin Other (See Comments)    Childhood allergy, suffocating feeling, rash also     Patient Measurements: Height: 5' 3"  (160 cm) Weight: 70.8 kg (156 lb) IBW/kg (Calculated) : 52.4 Adjusted Body Weight: 59.8 kg  Vital Signs: Temp: 101.2 F (38.4 C) (11/19 1057) Temp Source: Axillary (11/19 1057) BP: 118/63 (11/19 1131) Pulse Rate: 124 (11/19 1131)  Labs: Recent Labs    10/27/20 0837  WBC 8.3  HGB 11.9*  PLT 257   No results for input(s): GENTTROUGH, GENTPEAK, GENTRANDOM in the last 72 hours.   Microbiology: Recent Results (from the past 720 hour(s))  Strep Gp B Culture+Rflx     Status: None   Collection Time: 10/19/20  3:28 PM   Specimen: Genital   VR  Result Value Ref Range Status   Strep Gp B Culture+Rflx Negative Negative Final    Comment: Centers for Disease Control and Prevention (CDC) and American Congress of Obstetricians and Gynecologists (ACOG) guidelines for prevention of perinatal group B streptococcal (GBS) disease specify co-collection of a vaginal and rectal swab specimen to maximize sensitivity of GBS detection. Per the CDC and ACOG, swabbing both the lower vagina and rectum substantially increases the yield of detection compared with sampling the vagina alone. Penicillin G, ampicillin, or cefazolin are indicated for intrapartum prophylaxis of perinatal GBS colonization. Reflex susceptibility testing should be performed prior to use of clindamycin only on GBS isolates from penicillin-allergic women who are considered a high risk for anaphylaxis. Treatment with vancomycin without additional testing is warranted if resistance to clindamycin is noted.   Respiratory Panel by RT PCR (Flu A&B, Covid) - Nasopharyngeal Swab      Status: None   Collection Time: 10/27/20  9:49 AM   Specimen: Nasopharyngeal Swab; Nasopharyngeal(NP) swabs in vial transport medium  Result Value Ref Range Status   SARS Coronavirus 2 by RT PCR NEGATIVE NEGATIVE Final    Comment: (NOTE) SARS-CoV-2 target nucleic acids are NOT DETECTED.  The SARS-CoV-2 RNA is generally detectable in upper respiratoy specimens during the acute phase of infection. The lowest concentration of SARS-CoV-2 viral copies this assay can detect is 131 copies/mL. A negative result does not preclude SARS-Cov-2 infection and should not be used as the sole basis for treatment or other patient management decisions. A negative result may occur with  improper specimen collection/handling, submission of specimen other than nasopharyngeal swab, presence of viral mutation(s) within the areas targeted by this assay, and inadequate number of viral copies (<131 copies/mL). A negative result must be combined with clinical observations, patient history, and epidemiological information. The expected result is Negative.  Fact Sheet for Patients:  PinkCheek.be  Fact Sheet for Healthcare Providers:  GravelBags.it  This test is no t yet approved or cleared by the [place] FDA and  has been authorized for detection and/or diagnosis of SARS-CoV-2 by FDA under an Emergency Use Authorization (EUA). This EUA will remain  in effect (meaning this test can be used) for the duration of the COVID-19 declaration under Section 564(b)(1) of the Act, 21 U.S.C. section 360bbb-3(b)(1), unless the authorization is terminated or revoked sooner.     Influenza A by PCR NEGATIVE NEGATIVE Final   Influenza B by PCR NEGATIVE NEGATIVE Final    Comment: (NOTE) The Xpert Xpress SARS-CoV-2/FLU/RSV assay is intended as  an aid in  the diagnosis of influenza from Nasopharyngeal swab specimens and  should not be used as a sole basis for  treatment. Nasal washings and  aspirates are unacceptable for Xpert Xpress SARS-CoV-2/FLU/RSV  testing.  Fact Sheet for Patients: PinkCheek.be  Fact Sheet for Healthcare Providers: GravelBags.it  This test is not yet approved or cleared by the [place] FDA and  has been authorized for detection and/or diagnosis of SARS-CoV-2 by  FDA under an Emergency Use Authorization (EUA). This EUA will remain  in effect (meaning this test can be used) for the duration of the  Covid-19 declaration under Section 564(b)(1) of the Act, 21  U.S.C. section 360bbb-3(b)(1), unless the authorization is  terminated or revoked. Performed at [facility], [place] [address] [place], [place] [address]     Medications:  Cefazolin 2gm IV once  Goal of Therapy:  Gentamicin peak 20-25 mg/L and Trough < 1 mg/L  Plan:  Gentamicin 300 mg (84m/kg) IV x 1  Check Scr with next labs if gentamicin continued. Will check gentamicin levels if continued > 72hr or clinically indicated.  KWyline Mood11/19/2021,11:41 AM

## 2020-10-28 NOTE — Progress Notes (Signed)
Labor Progress Note Candice Ward is a 35 y.o. G2P0101 at 38w3dpresented for PPROM (0830), in active labor.  S: Pt comfortable with epidural in place. No concerns at this time.  O:  BP (!) 92/42   Pulse 91   Temp 98.8 F (37.1 C) (Oral)   Resp 18   Ht 5' 3"  (1.6 m)   Wt 70.8 kg   LMP 02/15/2020 (Exact Date)   SpO2 99%   BMI 27.63 kg/m  EFM: baseline 135 bpm / mod variability/+accels/intermittent variable decels IUPC: q 4 minutes, <205ms   CVE: Dilation: 6 Effacement (%): 80 Station: -1, 0 Presentation: Vertex Exam by:: JoJorja LoaRN   A&P: [age].o. G2G48P0101633w3desented for PPROM (0830), in active labor.  #Labor s/p PPROM: S/p BMZ at 1130 on 11/18. Throughout the day on 11/18 unable to initiate pitocin given prolonged decels with ambulation to restroom. Foley cath & IUPC now in place. Given improvement in FHTMohaveth amnioinfusion (initiated at 1745 on 11/18) and inadequate contractions by IUPC, pitocin initiated at 2200 on 11/18. Given adequate contractions in the setting of recurrent and more pronounced variable decels, pitocin was discontinued at 0215. Pitocin restarted at 0430 given inconsistent adequacy of contractions. Plan to recheck cervical exam in 2 hours or sooner as clinically indicated. No signs/symptoms of Triple I at this time.  #Pain: epidural in place #FWB: cat 2 strip given intermittent variables but reassuring pt with cervical change on last check and moderate variability throughout. In addition, +accels and good recovery between decels. Continued on amnioinfusion. #GBS negative #Contraception: desires post-placental Paraguard #H/o Preterm Abruption: Appropriate bloody show. Several small clots in latent labor. Will continue to monitor for worsening bleeding and signs of fetal intolerance on FHT.  AnnRanda NgoD   5:19 AM

## 2020-10-28 NOTE — Discharge Summary (Signed)
Postpartum Discharge Summary     Patient Name: Candice Ward DOB: December 07, 1986 MRN: [id number]  Date of admission: 10/27/2020 Delivery date:10/28/2020  Delivering provider: Layla Barter  Date of discharge: 10/30/2020  Admitting diagnosis: Preterm premature rupture of membranes (PPROM) with unknown onset of labor [O42.919] Intrauterine pregnancy: [redacted]w[redacted]d    Secondary diagnosis:  Active Problems:   Supervision of high risk pregnancy, antepartum   History of placental abruption   Previous preterm delivery, antepartum   Preterm premature rupture of membranes (PPROM) with unknown onset of labor   LGA (large for gestational age) fetus affecting management of mother  Additional problems: None    Discharge diagnosis: Preterm Pregnancy Delivered                                              Post partum procedures:none Augmentation: AROM, Pitocin and Cytotec Complications: Triple I  Hospital course: Onset of Labor With Vaginal Delivery      34y.o. yo G2P0101 at 333w4das admitted in Latent Labor on 10/27/2020. Patient had an uncomplicated labor course as follows:  Membrane Rupture Time/Date: 9:15 AM ,10/27/2020   Delivery Method:Vaginal, Spontaneous  Episiotomy:   Lacerations:  2nd degree;Perineal  Patient had an uncomplicated postpartum course.  She is ambulating, tolerating a regular diet, passing flatus, and urinating well. Patient is discharged home in stable condition on 10/30/20.  Newborn Data: Birth date:10/28/2020  Birth time:12:21 PM  Gender:Female  Living status:Living  Apgars:8 ,9  Weight:3380 g   Magnesium Sulfate received: No BMZ received: Yes Rhophylac:N/A MMR:N/A T-DaP:Given prenatally Flu: Yes Transfusion:No  Physical exam  Vitals:   10/29/20 1419 10/29/20 1928 10/29/20 2304 10/30/20 0516  BP: 97/65 (!) 115/59 112/60 112/65  Pulse: 68 63  65  Resp: 16 17  18   Temp: 99.1 F (37.3 C) 98.3 F (36.8 C)  98.2 F (36.8 C)  TempSrc: Oral   Oral   SpO2:  100%  100%  Weight:      Height:       General: alert, cooperative and no distress Lochia: appropriate Uterine Fundus: firm Incision: N/A DVT Evaluation: No evidence of DVT seen on physical exam. Labs: Lab Results  Component Value Date   WBC 8.3 10/27/2020   HGB 11.9 (L) 10/27/2020   HCT 37.2 10/27/2020   MCV 89.0 10/27/2020   PLT 257 10/27/2020   No flowsheet data found. Edinburgh Score: Edinburgh Postnatal Depression Scale Screening Tool 10/29/2020  I have been able to laugh and see the funny side of things. 0  I have looked forward with enjoyment to things. 0  I have blamed myself unnecessarily when things went wrong. 0  I have been anxious or worried for no good reason. 0  I have felt scared or panicky for no good reason. 0  Things have been getting on top of me. 0  I have been so unhappy that I have had difficulty sleeping. 0  I have felt sad or miserable. 0  I have been so unhappy that I have been crying. 0  The thought of harming myself has occurred to me. 0  Edinburgh Postnatal Depression Scale Total 0     After visit meds:  Allergies as of 10/30/2020      Reactions   Amoxicillin Other (See Comments)   Childhood allergy, suffocating feeling, rash also  Medication List    TAKE these medications   ibuprofen 800 MG tablet Commonly known as: ADVIL Take 1 tablet (800 mg total) by mouth every 8 (eight) hours as needed.   multivitamin-prenatal 27-0.8 MG Tabs tablet Take 1 tablet by mouth daily at 12 noon.        Discharge home in stable condition Infant Feeding: Breast Infant Disposition:home with mother Discharge instruction: per After Visit Summary and Postpartum booklet. Activity: Advance as tolerated. Pelvic rest for 6 weeks.  Diet: routine diet Future Appointments: Future Appointments  Date Time Provider [place]  11/02/2020  2:55 PM Caren Macadam, MD [facility]   Follow up Visit:   Please schedule this  patient for a In person postpartum visit in 4 weeks with the following provider: Any provider. Additional Postpartum F/U:IUD insertion at postpartum visit  Low risk pregnancy complicated by: preterm Delivery mode:  Vaginal, Spontaneous  Anticipated Birth Control:  IUD   Marcille Buffy DNP, CNM  10/30/20  11:00 AM

## 2020-10-28 NOTE — Progress Notes (Signed)
.  Labor Progress Note [name] is a 34 y.o. G2P0101 at 94w3dpresented for PPROM (0830), in active labor.  S: Pt comfortable with epidural in place. No concerns at this time.  O:  BP 111/75   Pulse (!) 104   Temp 100 F (37.8 C)   Resp 18   Ht 5' 3"  (1.6 m)   Wt 70.8 kg   LMP 02/15/2020 (Exact Date)   SpO2 99%   BMI 27.63 kg/m  EFM: baseline 145 bpm / mod variability/+accels/intermittent late decelerations IUPC: q 3 minutes, 2024ms   CVE: Dilation: 8.5 Effacement (%): 90 Station: 0 Presentation: Vertex Exam by:: J.Cox, RN   A&P: [age].o. [id number] for PPROM (0830), in active labor.  #Labor s/p PPROM: Resumed pit at 0900. Is progressing well based on most recent SVE. Patient continues to have variable decels that seem to resolve with repositioning. Also appears these decels align with lower blood pressure readings. Plan will be to hold on pit for now and will treat SBP lower than 110.   #Pain: epidural in place #FWB: cat 2 strip given intermittent variables/late decelerations but reassuring pt with cervical change on last check and moderate variability throughout. In addition, +accels and good recovery between decels. Continued on amnioinfusion. #GBS negative #Contraception: IUD outpatient #H/o Preterm Abruption: Appropriate bloody show. Several small clots in latent labor. Will continue to monitor for worsening bleeding and signs of fetal intolerance on FHT.  Candice Ward   10:33 AM

## 2020-10-28 NOTE — Anesthesia Postprocedure Evaluation (Signed)
Anesthesia Post Note  Patient: Candice Ward  Procedure(s) Performed: AN AD HOC LABOR EPIDURAL     Patient location during evaluation: Mother Baby Anesthesia Type: Epidural Level of consciousness: awake and alert Pain management: pain level controlled Vital Signs Assessment: post-procedure vital signs reviewed and stable Respiratory status: spontaneous breathing, nonlabored ventilation and respiratory function stable Cardiovascular status: stable Postop Assessment: no headache, no backache and epidural receding Anesthetic complications: no   No complications documented.  Last Vitals:  Vitals:   10/28/20 1450 10/28/20 1607  BP: 131/63 113/69  Pulse: (!) 59 66  Resp: 16 16  Temp:  36.9 C  SpO2: 98% 98%    Last Pain:  Vitals:   10/28/20 1607  TempSrc: Oral  PainSc: 5                  Helaine Yackel

## 2020-10-28 NOTE — Lactation Note (Signed)
This note was copied from a baby's chart. Lactation Consultation Note  Patient Name: Candice Ward Date: 10/28/2020 Reason for consult: Initial assessment;Mother's request;1st time breastfeeding;Late-preterm 34-36.6wks;NICU baby;Other (Comment) (TTN)  Infant is 36 weeks and 6 hours old. Infant in the NICU for TTN. Mom hx of pumping and bottle feeding first infant for 3 months. D/C due to low milk supply.   Parents asked about galactagogues. LC went over with Mom the importance of stimulation with breast massage and hand expression. Mom has a medela pump at home.  RN set up DEBP. LC checked flange size of 24 which Mom states was comfortable on initial setting.   LC reviewed pump parts, assembly, cleaning and milk storage with parents. RN provided labels for Mom to transport milk in snappies to the NICU.   Plan 1. Mom to use DEBP q 3 hours for 15 minutes. Mom to transport EBM in snappies to the NICU.           2. I's and O's sheet reviewed with parents.            3. [facility] brochure of inpatient and outpatient services reviewed.

## 2020-10-29 NOTE — Progress Notes (Addendum)
POSTPARTUM PROGRESS NOTE  Subjective: Candice Ward is a 34 y.o. [id number] s/p SVD at [redacted]w[redacted]d  She reports she is doing well. No acute events overnight. Has remained afebrile overnight. She denies any problems with ambulating, voiding or po intake. Denies nausea or vomiting. She has passed flatus and urinated. Pain is well controlled.  Lochia is present but denies any passage of clots. Denies any dizziness or feeling lightheaded with ambulation.   Objective: Blood pressure 104/65, pulse 68, temperature 98 F (36.7 C), temperature source Oral, resp. rate 16, height 5' 3"  (1.6 m), weight 70.8 kg, last menstrual period 02/15/2020, SpO2 99 %, unknown if currently breastfeeding.  Physical Exam:  General: alert, cooperative and no distress Chest: no respiratory distress Abdomen: soft, non-tender  Uterine Fundus: firm and at level of umbilicus Extremities: No calf swelling or tenderness  No edema  Recent Labs    10/27/20 0837  HGB 11.9*  HCT 37.2    Assessment/Plan: Breyona Al BManual Meieris a 34y.o. [id number]/p SVD at 339w4dor PPROM.  Routine Postpartum Care: Doing well, pain well-controlled.  -- Continue routine care, lactation support  --Triple I: Pt febrile to 101.5 in second stage at 1209 on 11/19. S/p zosyn/ancef. Has remained afebrile overnight. VSS. -- Contraception: IUD outpatient -- Feeding: breast feed  Dispo: Plan for discharge home tomorrow.  BeLayla BarterMD 10/29/2020 7:09 AM  Attestation of Supervision of Resident:  I confirm that I have verified the information documented in the  resident's  note and that I have also personally reperformed the history, physical exam and all medical decision making activities.  I have verified that all services and findings are accurately documented in this student's note; and I agree with management and plan as outlined in the documentation. I have also made any necessary editorial changes.  AnRanda NgoMDEagle Mountainor WoRepublic County HospitalCoMiamiroup 10/29/2020 8:02 AM

## 2020-10-29 NOTE — Progress Notes (Addendum)
Patient requested that doctor be called due to swelling  In feet. Patient has  edema in both feet.  Called Candice Ward advised to reassure patient that the swelling is normal and to keep feet elevated . Patients blood pressure is 112/60.  Will continue to monitor

## 2020-10-29 NOTE — Progress Notes (Signed)
Took mom to NICU to see baby. Gave instructions on how to reach me when she would like to return.

## 2020-10-29 NOTE — Lactation Note (Addendum)
This note was copied from a baby's chart. Lactation Consultation Note  Patient Name: Candice Ward [name] Date: 10/29/2020 Reason for consult: Follow-up assessment;Mother's request;NICU baby;Late-preterm 34-36.6wks  LC to room at RN request. Mother in room and in need of bf assistance. [place] assisted with positioning. Baby attempted briefly and then began whimpering. LC placed baby sts and he became quiet. Relayed to RN. LC will return to assist further prn.    LATCH Score Latch: Too sleepy or reluctant, no latch achieved, no sucking elicited.  Audible Swallowing: None  Type of Nipple: Everted at rest and after stimulation  Comfort (Breast/Nipple): Soft / non-tender  Hold (Positioning): Assistance needed to correctly position infant at breast and maintain latch.  LATCH Score: 5  Interventions Interventions: Breast feeding basics reviewed;Support pillows;Assisted with latch;Position options;Skin to skin;Expressed milk;Hand express;DEBP;Hand pump;Breast compression;Adjust position  Lactation Tools Discussed/Used   52m nipple shield  Consult Status Consult Status: Follow-up Date: 10/30/20 Follow-up type: In-patient    LGwynne Edinger11/20/2021, 2:09 PM

## 2020-10-29 NOTE — Lactation Note (Signed)
This note was copied from a baby's chart. Lactation Consultation Note  Patient Name: Candice Ward [id number] Date: 10/29/2020 Reason for consult: Follow-up assessment;NICU baby  LC to room for follow up visit.  Mother is using pump q 3 hours. Reviewed HE to maximize colostrum removal and milk production. Patient was provided with the opportunity to ask questions. All concerns were addressed.  Will plan follow up visit.    Consult Status Consult Status: Follow-up Date: 10/30/20 Follow-up type: In-patient    Gwynne Edinger 10/29/2020, 10:19 AM

## 2020-10-30 MED ORDER — IBUPROFEN 800 MG PO TABS: 800.0000 mg | ORAL_TABLET | Freq: Three times a day (TID) | ORAL | 0 refills | Status: AC | PRN

## 2020-10-30 NOTE — Lactation Note (Signed)
This note was copied from a baby's chart. Lactation Consultation Note  Patient Name: Candice Ward [id number] Date: 10/30/2020 Reason for consult: Follow-up assessment;Mother's request;NICU baby;Late-preterm 34-36.6wks   LC to room at [facility] request. Mother and baby positioned in cross cradle hold and with nipple shield when [place] arrived. LC observed about 5 minutes of rhythmic suckling before baby became sleepy and non-nutritive. RN to begin gavage feeding. LC encouraged mom to leave baby at breast for the remainder of the 30 minute bf session and then place sts, if desired. He may be encouraged to resume nutritive suck with gavage feeding. Reviewed feeding norms of late preterm infant and encouraged mom to continue POC. Patient was provided with the opportunity to ask questions. All concerns were addressed.  Will plan follow up visit.   LATCH Score Latch: Repeated attempts needed to sustain latch, nipple held in mouth throughout feeding, stimulation needed to elicit sucking reflex.  Audible Swallowing: A few with stimulation  Type of Nipple: Everted at rest and after stimulation  Comfort (Breast/Nipple): Soft / non-tender  Hold (Positioning): Assistance needed to correctly position infant at breast and maintain latch.  LATCH Score: 7  Interventions Interventions: Breast feeding basics reviewed;Support pillows;Assisted with latch;Position options;Skin to skin;Expressed milk;DEBP    Consult Status Consult Status: Follow-up Date: 10/31/20 Follow-up type: In-patient    Candice Ward 10/30/2020, 11:26 AM

## 2020-10-30 NOTE — Lactation Note (Signed)
This note was copied from a baby's chart. Lactation Consultation Note  Patient Name: Candice Ward [id number] Date: 10/30/2020 Reason for consult: Follow-up assessment;NICU baby;Late-preterm 34-36.6wks  LC to room for f/u visit. Mother and father present at infant's bedside. Mother currently bottle feeding. She continues to pump q 3 hours and is offering her colostrum to baby. Mother would like to re-challenge at the breast today. Will plan 1100 feeding appointment. Confirmed with RN.    Consult Status Consult Status: Follow-up Date: 10/30/20 Follow-up type: In-patient    Gwynne Edinger 10/30/2020, 8:37 AM

## 2020-10-31 ENCOUNTER — Telehealth: Payer: Self-pay | Admitting: Family Medicine

## 2020-10-31 NOTE — Telephone Encounter (Signed)
Pt is aware of PP and IUD appointments

## 2020-10-31 NOTE — Progress Notes (Signed)
Patient screened out for psychosocial assessment since none of the following apply: °Psychosocial stressors documented in mother or baby's chart °Gestation less than 32 weeks °Code at delivery  °Infant with anomalies °Please contact the Clinical Social Worker if specific needs arise, by MOB's request, or if MOB scores greater than 9/yes to question 10 on [place] Postpartum Depression Screen. ° °Akeia Perot Boyd-Gilyard, MSW, LCSW °Clinical Social Work °[phone] °  °

## 2020-11-02 ENCOUNTER — Encounter: Payer: No Typology Code available for payment source | Admitting: Family Medicine

## 2020-11-03 ENCOUNTER — Ambulatory Visit: Payer: Self-pay

## 2020-11-03 NOTE — Lactation Note (Signed)
This note was copied from a baby's chart. Lactation Consultation Note  Patient Name: Candice Ward [id number] Date: 11/03/2020 Reason for consult: Follow-up assessment;NICU baby;Mother's request;Nipple pain/trauma Baby to d/c today. Mom with crack on face of L nipple. Provided hydrogel. Reviewed signs/symptoms of mastitis. Patient was provided with the opportunity to ask questions. All concerns were addressed.    Interventions Interventions: Breast feeding basics reviewed;Coconut oil;Expressed milk;Breast massage;Hand express;Comfort gels;Breast compression  Lactation Tools Discussed/Used  hydrogel pads provided along with instructions for use.   Consult Status Consult Status: Complete Date: 11/03/20 Follow-up type: In-patient    Gwynne Edinger 11/03/2020, 12:27 PM

## 2020-11-14 ENCOUNTER — Other Ambulatory Visit: Payer: Self-pay | Admitting: Family Medicine

## 2020-11-14 ENCOUNTER — Telehealth: Payer: Self-pay | Admitting: General Practice

## 2020-11-14 DIAGNOSIS — N61 Mastitis without abscess: Secondary | ICD-10-CM

## 2020-11-14 MED ORDER — CLINDAMYCIN HCL 150 MG PO CAPS: 450.0000 mg | ORAL_CAPSULE | Freq: Three times a day (TID) | ORAL | 0 refills | Status: AC

## 2020-11-14 MED ORDER — CEPHALEXIN 500 MG PO CAPS: 500.0000 mg | ORAL_CAPSULE | Freq: Four times a day (QID) | ORAL | 0 refills | Status: AC

## 2020-11-14 NOTE — Telephone Encounter (Signed)
Called patient regarding mychart message and asked for additional information about her breast pain. Patient states her right breast became very sore and tender yesterday evening. She reports a "fever of 100 something", chills, body aches, fatigue, and decreased milk production from right breast. Patient states the baby is refusing to breast feed since last night so she is pumping every 2-3 hours. Patient is taking ibuprofen and tylenol. Per chart review, patient delivered 2.5 weeks ago. Patient denies fever today but continues to have body aches/fatigue. Keflex 524m QID x 10 days sent to pharmacy per Dr MBerniece Andreasfor mastitis. Informed patient of prescription. Discussed warm, moist compresses, gentle massage, pumping or breastfeeding every 2-3 hours but to not go past 3 hours, drink plenty of fluids and get rest. Recommended since baby is refusing feeds to try to give part of a bottle at first to satisfy baby and then try latching baby. Patient verbalized understanding. Discussed lactation appt later this week for follow up with her and baby. Patient verbalized understanding.

## 2020-11-14 NOTE — Progress Notes (Unsigned)
rx for likely mastitis

## 2020-11-15 ENCOUNTER — Encounter: Payer: Self-pay | Admitting: General Practice

## 2020-11-17 ENCOUNTER — Encounter: Payer: Self-pay | Admitting: General Practice

## 2020-11-29 ENCOUNTER — Encounter: Payer: Self-pay | Admitting: Advanced Practice Midwife

## 2020-11-29 ENCOUNTER — Other Ambulatory Visit: Payer: Self-pay

## 2020-11-29 ENCOUNTER — Ambulatory Visit (INDEPENDENT_AMBULATORY_CARE_PROVIDER_SITE_OTHER): Payer: No Typology Code available for payment source | Admitting: Advanced Practice Midwife

## 2020-11-29 ENCOUNTER — Encounter: Payer: Self-pay | Admitting: General Practice

## 2020-11-29 ENCOUNTER — Ambulatory Visit: Payer: No Typology Code available for payment source | Admitting: Family Medicine

## 2020-11-29 DIAGNOSIS — O99893 Other specified diseases and conditions complicating puerperium: Secondary | ICD-10-CM

## 2020-11-29 DIAGNOSIS — N63 Unspecified lump in unspecified breast: Secondary | ICD-10-CM

## 2020-11-29 NOTE — Progress Notes (Signed)
[facility] Part[facility] Visit Note  Candice Ward Manual Meier is a 34 y.o. [phone] female who presents for a postpartum visit. She is 4 weeks postpartum following a normal spontaneous vaginal delivery.  I have fully reviewed the prenatal and intrapartum course. The delivery was at 36/3 gestational weeks.  Anesthesia: epidural. Postpartum course has been uncomplicated. Baby is doing well. Baby is feeding by both breast and bottle - [place] Good Start. Bleeding no bleeding. Bowel function is normal. Bladder function is normal. Patient is not sexually active. Contraception method is none. Postpartum depression screening: negative.   Patient was treated for mastitis on 11/14/2020 and that has resolved. This was on the right breast. However, on her left breast she has had pain and bleeding when she pumped. So she has not used her left breast to breast feed the baby and now she has stopped pumping as well. She states that she was pumping on that side because the baby would not nurse on that breast.   The pregnancy intention screening data noted above was reviewed. Potential methods of contraception were discussed. The patient elected to proceed with IUD or IUS.     The following portions of the patient's history were reviewed and updated as appropriate: allergies, current medications, past family history, past medical history, past social history, past surgical history and problem list.  Review of Systems Pertinent items are noted in HPI.     Objective:  BP 118/79   Pulse 78   Ht 5' 3"  (1.6 m)   Wt 138 lb 8 oz (62.8 kg)   BMI 24.53 kg/m    Physical Exam Vitals and nursing note reviewed. Exam conducted with a chaperone present.  HENT:     Head: Normocephalic.  Cardiovascular:     Rate and Rhythm: Normal rate.  Pulmonary:     Effort: Pulmonary effort is normal.  Chest:    Abdominal:     General: Abdomen is flat.     Tenderness: There is no abdominal tenderness.  Skin:    General: Skin is warm and dry.   Neurological:     Mental Status: She is alert and oriented to person, place, and time.  Psychiatric:        Mood and Affect: Mood normal.   1cmx1cm lump on the areola on the right breast.   Assessment:   Normal postpartum exam. Pap smear not done at today's visit.  Patient declines pap today Wants to return in January for IUD, and will get pap done at that visit   Plan:   Essential components of care per ACOG recommendations:  1.  Mood and well being: Patient with negative depression screening today. Reviewed local resources for support.  - Patient does not use tobacco. NA If using tobacco we discussed reduction and for recently cessation risk of relapse - hx of drug use? No  NA If yes, discussed support systems  2. Infant care and feeding:  -Patient currently breastmilk feeding? Yes If breastmilk feeding discussed return to work and pumping. If needed, patient was provided letter for work to allow for every 2-3 hr pumping breaks, and to be granted a private location to express breastmilk and refrigerated area to store breastmilk. Reviewed importance of draining breast regularly to support lactation. -Social determinants of health (SDOH) reviewed in EPIC. No concerns  3. Sexuality, contraception and birth spacing - Patient does not want a pregnancy in the next year.  Desired family size is unsure children.  - Reviewed forms of  contraception in tiered fashion. Patient desired no method today, but wants IUD placed in January  today.   - Discussed birth spacing of 18 months  4. Sleep and fatigue -Encouraged family/partner/community support of 4 hrs of uninterrupted sleep to help with mood and fatigue  5. Physical Recovery  - Discussed patients delivery and complications - Patient had a 2nd degree laceration, perineal healing reviewed. Patient expressed understanding - Patient has urinary incontinence? No  - Patient is safe to resume physical and sexual activity  6.  Health  Maintenance - Last pap smear done unsure and was unsure results - offered pap today, but patient declines and would like to return another day for this.  NA, Mammogram  7. Breast Ultrasound ordered for breast lump on areola.   Marcille Buffy DNP, CNM  11/29/20  10:27 AM

## 2020-12-06 ENCOUNTER — Ambulatory Visit: Payer: No Typology Code available for payment source | Admitting: Obstetrics and Gynecology

## 2020-12-06 ENCOUNTER — Ambulatory Visit: Payer: No Typology Code available for payment source | Admitting: Family Medicine

## 2020-12-06 ENCOUNTER — Other Ambulatory Visit: Payer: Self-pay | Admitting: Advanced Practice Midwife

## 2020-12-06 DIAGNOSIS — N6320 Unspecified lump in the left breast, unspecified quadrant: Secondary | ICD-10-CM

## 2020-12-21 ENCOUNTER — Ambulatory Visit: Payer: No Typology Code available for payment source | Admitting: Advanced Practice Midwife

## 2021-01-03 ENCOUNTER — Other Ambulatory Visit: Payer: No Typology Code available for payment source

## 2021-01-03 ENCOUNTER — Inpatient Hospital Stay: Admission: RE | Admit: 2021-01-03 | Payer: No Typology Code available for payment source | Source: Ambulatory Visit

## 2021-11-12 IMAGING — US US MFM OB TRANSVAGINAL
1 series · 15 of 24 positions shown · non-contrast
Comparison: none

[Series 1: us mfm ob transvaginal · 24 acquisitions, 15 frames shown]
[im 1/24]
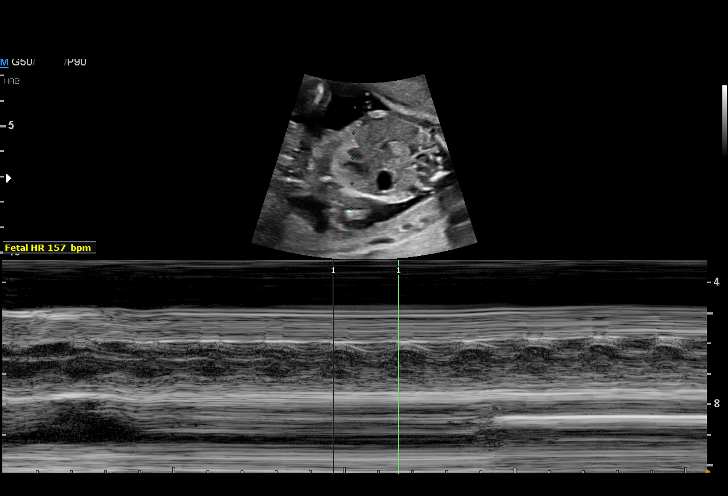
[im 3/24]
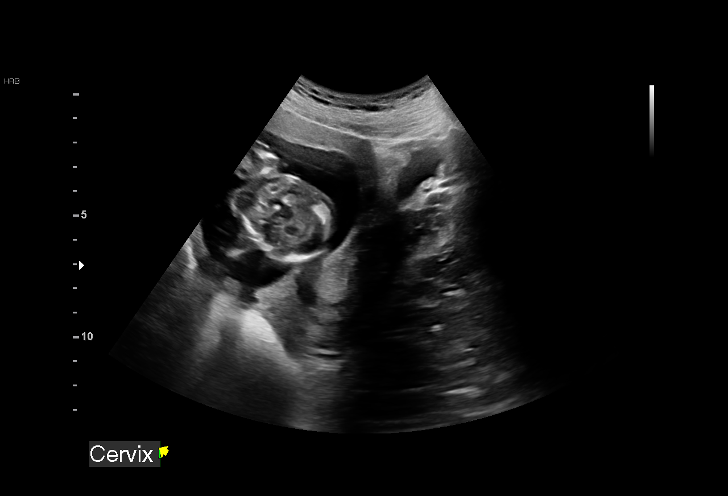
[im 5/24]
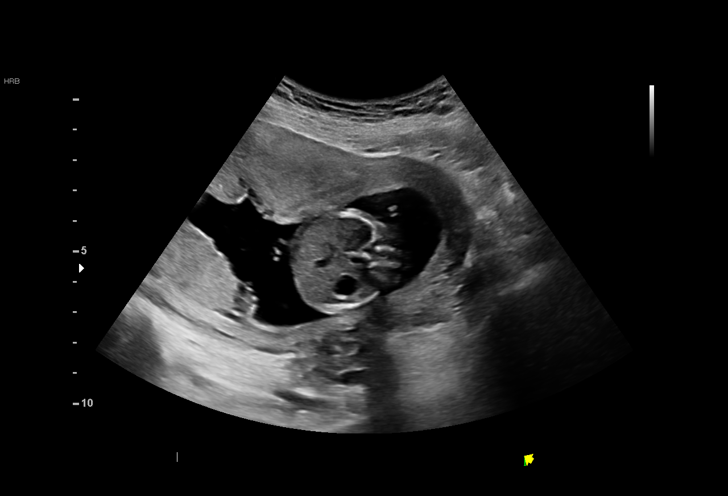
[im 6/24]
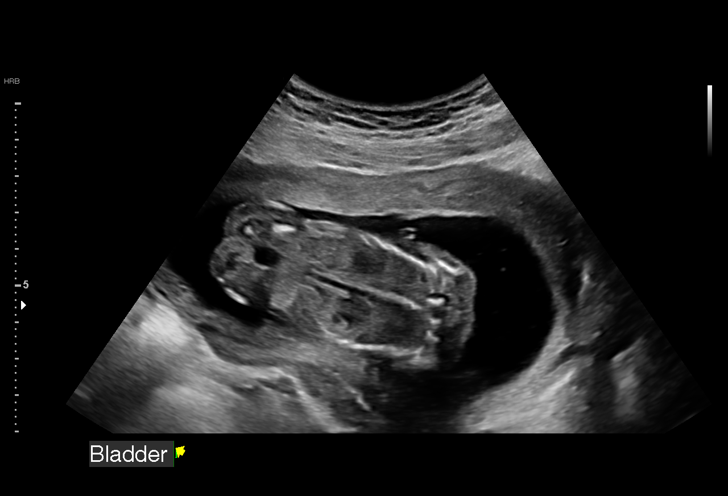
[im 8/24]
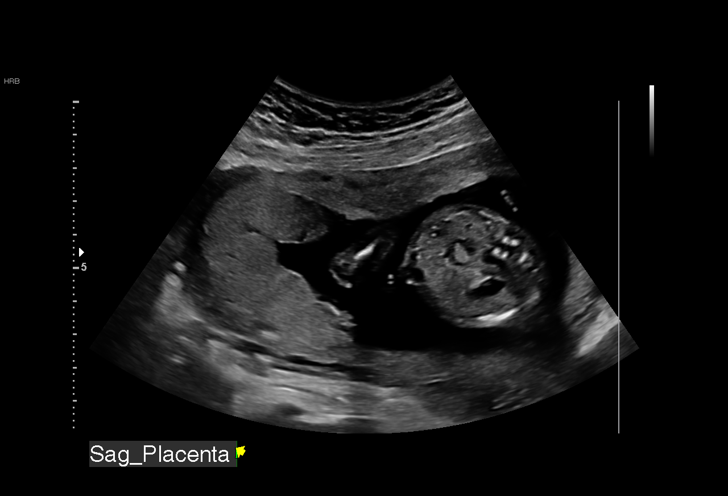
[im 9/24]
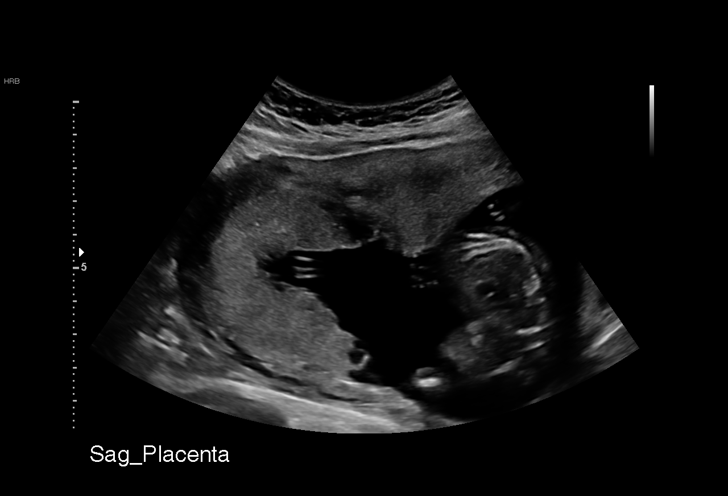
[im 11/24]
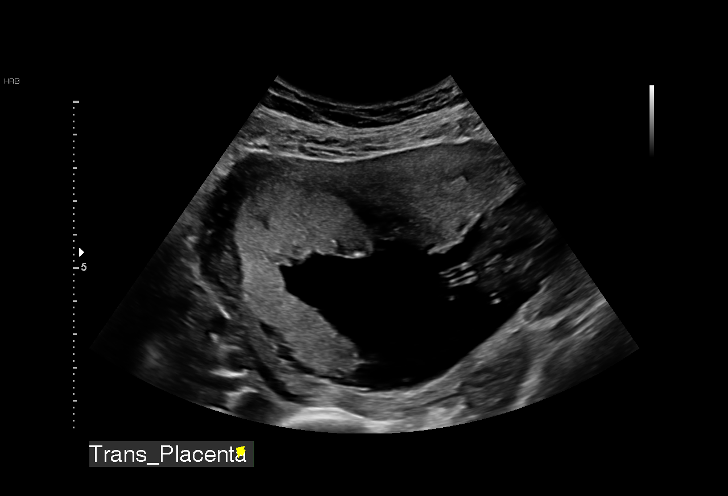
[im 13/24]
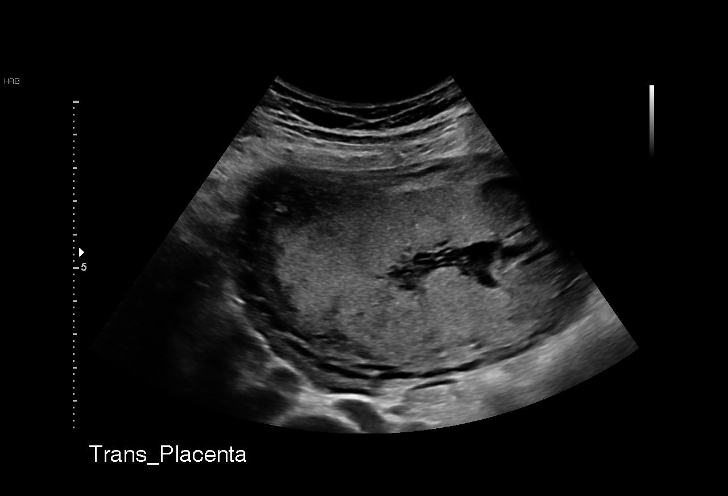
[im 14/24]
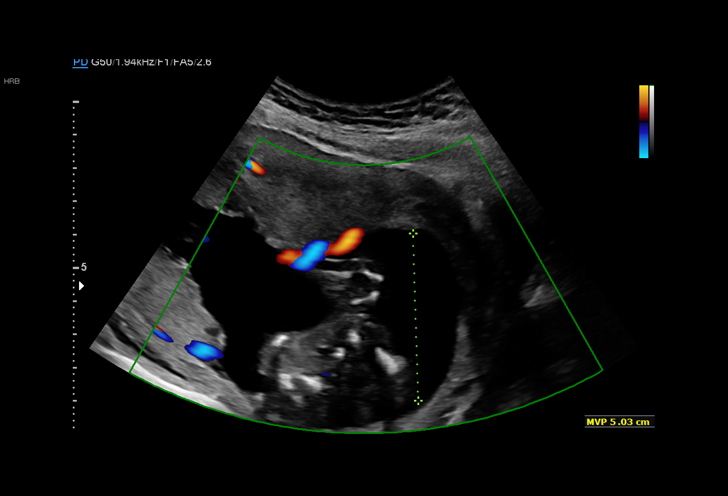
[im 16/24]
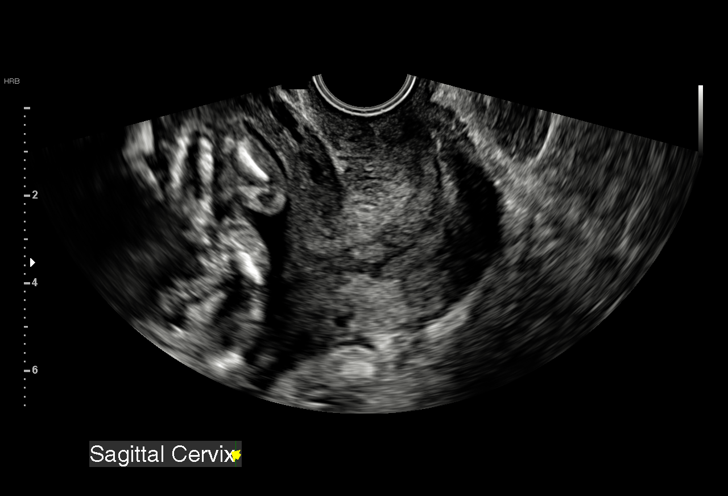
[im 17/24]
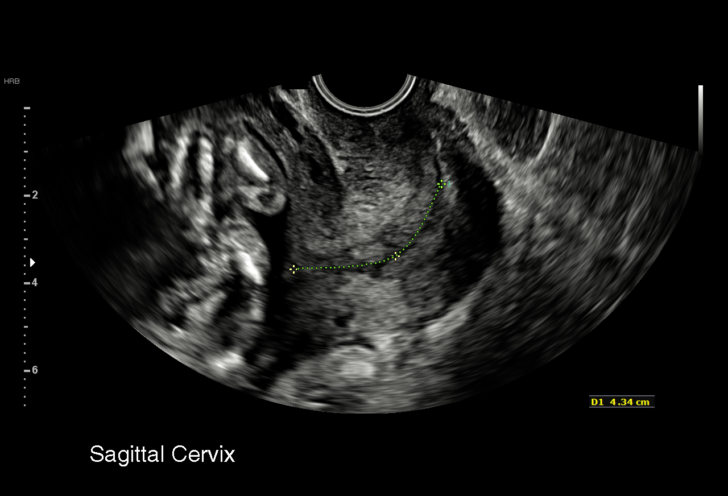
[im 19/24]
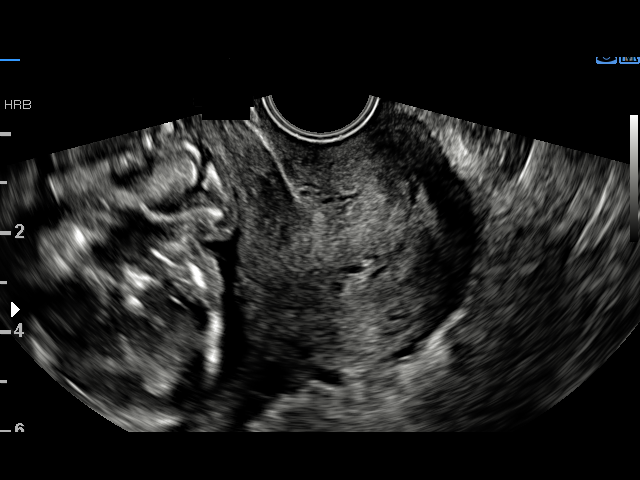
[im 21/24]
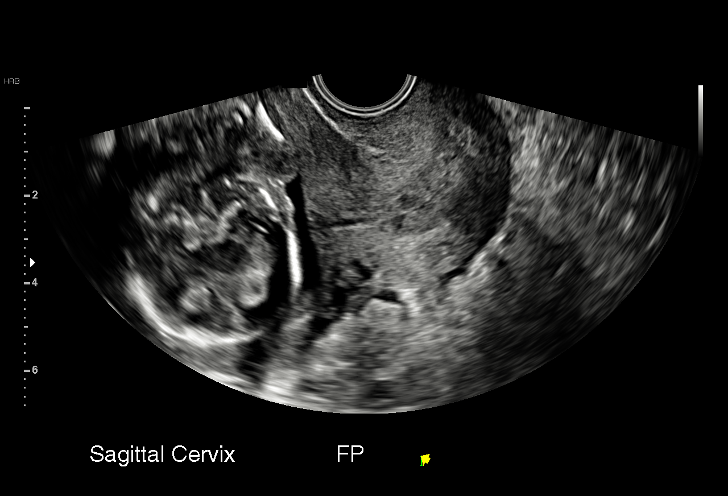
[im 22/24]
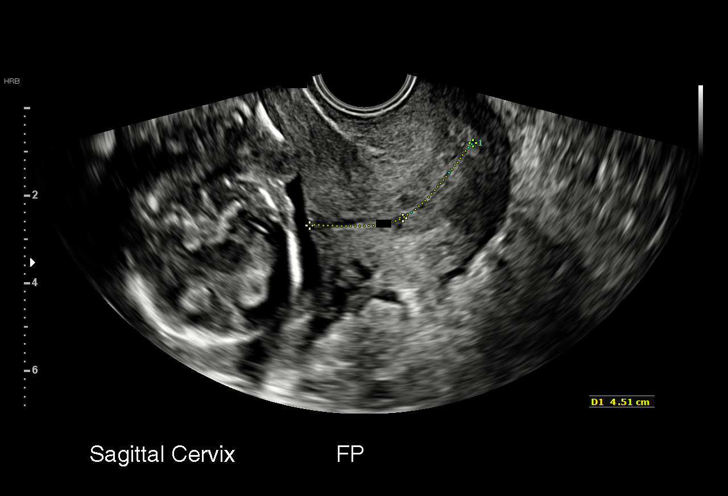
[im 24/24]
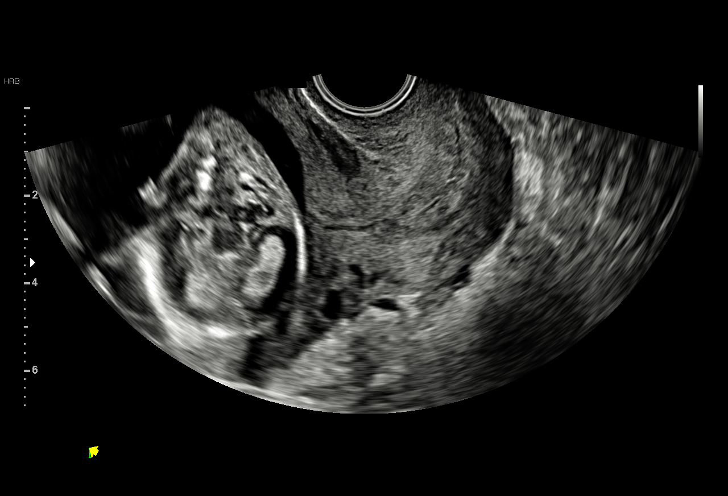

[15 of 24 positions shown; findings below may reference images not displayed]

1  US MFM OB TRANSVAGINAL                76817.2     BAJERIUKAI MOVSISIAN

Indications

 Poor obstetric history: Previous preterm
 delivery, antepartum (@28 weeks due to
 placental abruption)
 16 weeks gestation of pregnancy ([employer]
 WNL)
 Encounter for cervical length
Vital Signs

 BMI:
Fetal Evaluation

 Num Of Fetuses:         1
 Fetal Heart Rate(bpm):  157
 Cardiac Activity:       Observed
 Placenta:               Fundal

 Amniotic Fluid
 AFI FV:      Within normal limits

                             Largest Pocket(cm)
                             5
OB History

 Gravidity:    2         Term:   0        Prem:   1
 Living:       1
Gestational Age

 LMP:           16w 3d        Date:  02/15/20                 EDD:   11/21/20
 Best:          16w 3d     Det. By:  LMP  (02/15/20)          EDD:   11/21/20
Anatomy

 Stomach:               Appears normal, left   Bladder:                Appears normal
                        sided
Cervix Uterus Adnexa

 Cervix
 Length:           4.51  cm.
 Measured transvaginally.
Impression

 Patient is here for cervical length measurement.  Obstetric
 history is significant for a preterm delivery at 28 weeks
 gestation.  The presence of retroplacental clots raise the
 consultation on 11/09/2019 with Dr. Sheix.  A
 recommendation of biweekly cervical length measurements
 from 16 weeks till 24 weeks was made.

 On cell free fetal DNA screening, the risks of fetal
 aneuploidies are not increased.

 A limited ultrasound showed normal amniotic fluid and good
 fetal activity.  We performed a transvaginal ultrasound to
 evaluate the cervix.  The cervix measures 4.5 cm, which is
 within normal limits.  We reassured the patient of the findings.
Recommendations

 -Detailed fetal anatomy scan and cervical length
 measurement in 2 weeks
 -Transvaginal cervical length measurement every 2 weeks till
 24 weeks.
                 Oselame, Jerlene

## 2021-12-24 IMAGING — US US MFM OB FOLLOW-UP
1 series · 13 of 28 positions shown · non-contrast
Comparison: none

[Series 1: us mfm ob follow-up · 62 acquisitions, 13 frames shown]
[im 3/62]
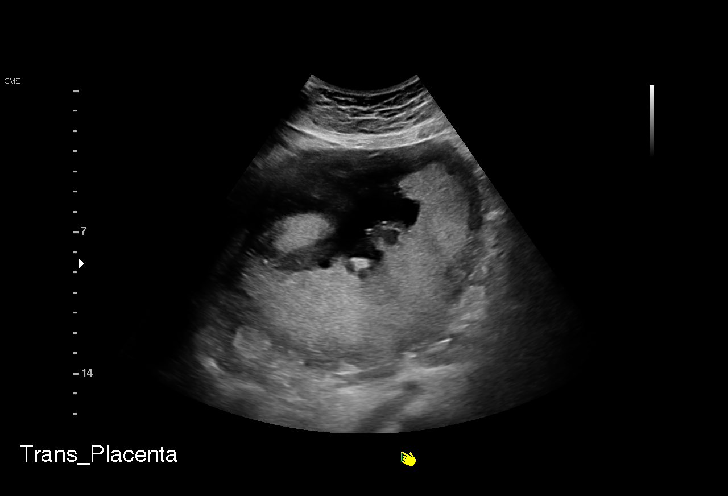
[im 7/62]
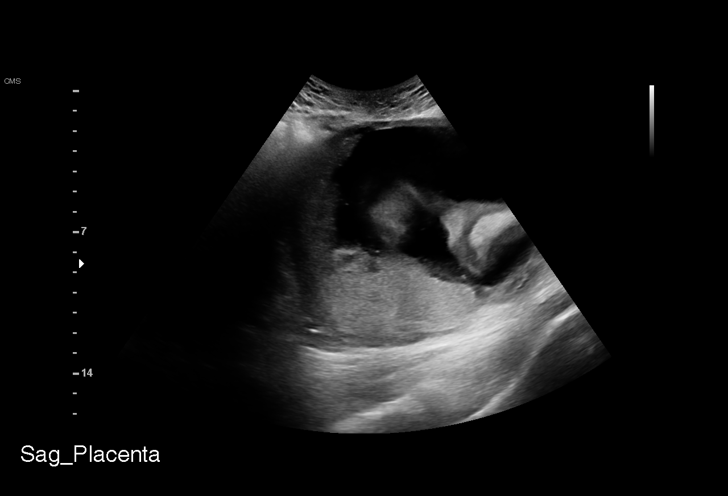
[im 12/62]
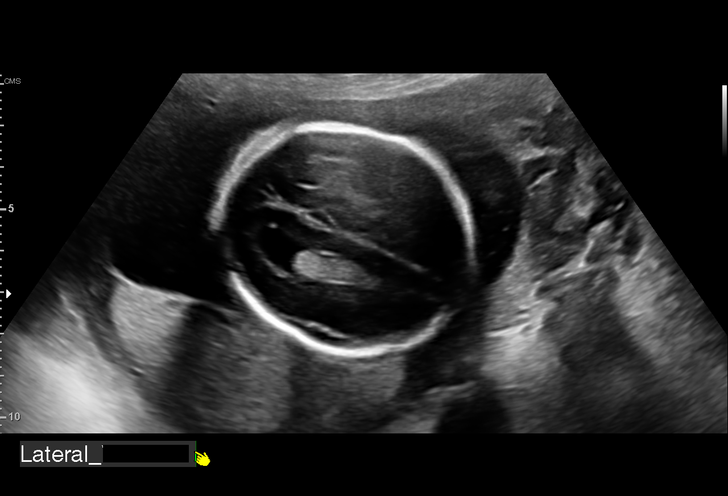
[im 16/62]
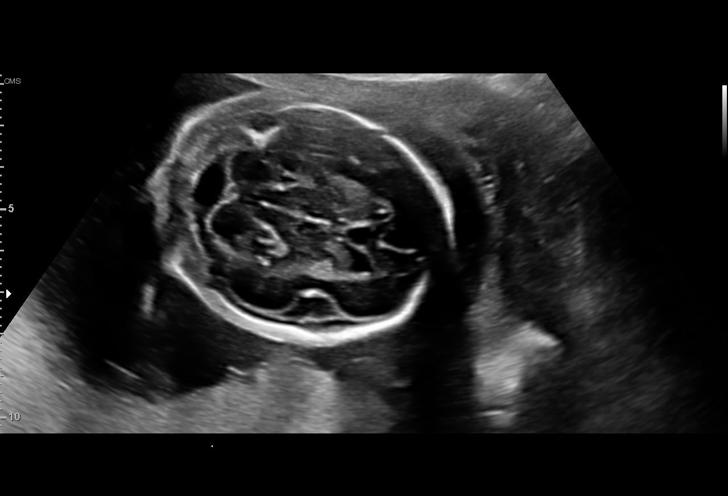
[im 21/62]
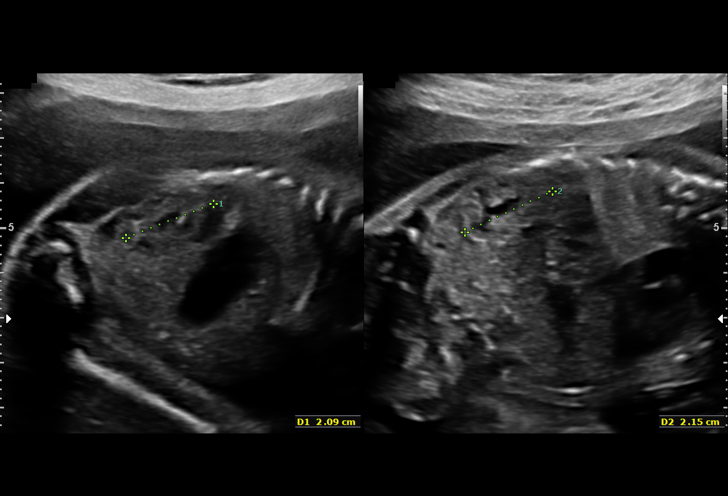
[im 25/62]
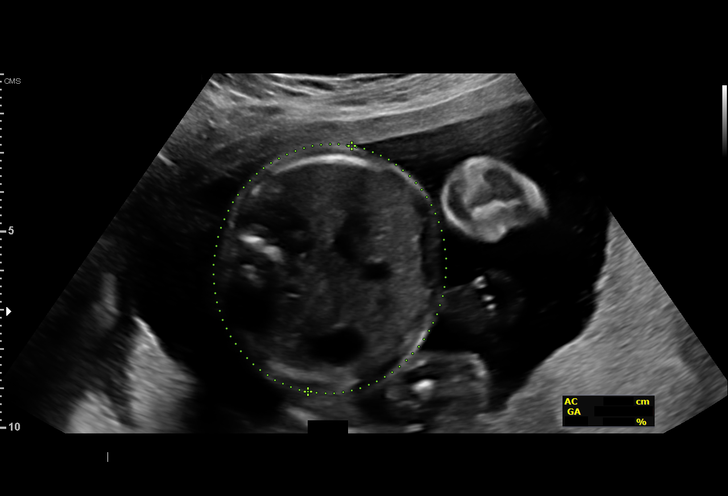
[im 32/62]
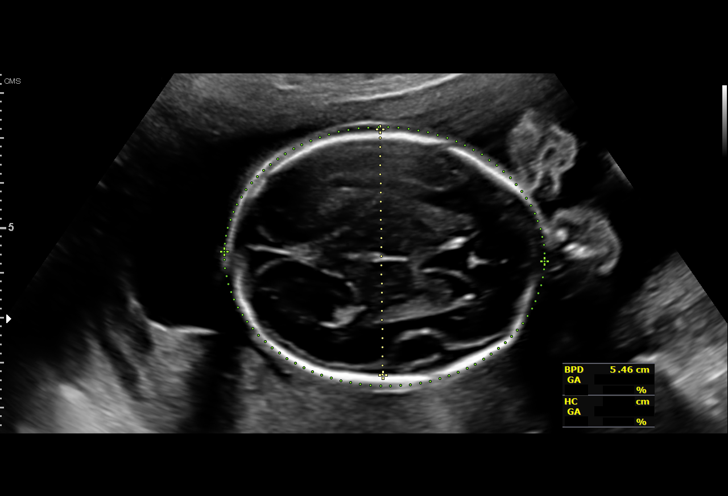
[im 37/62]
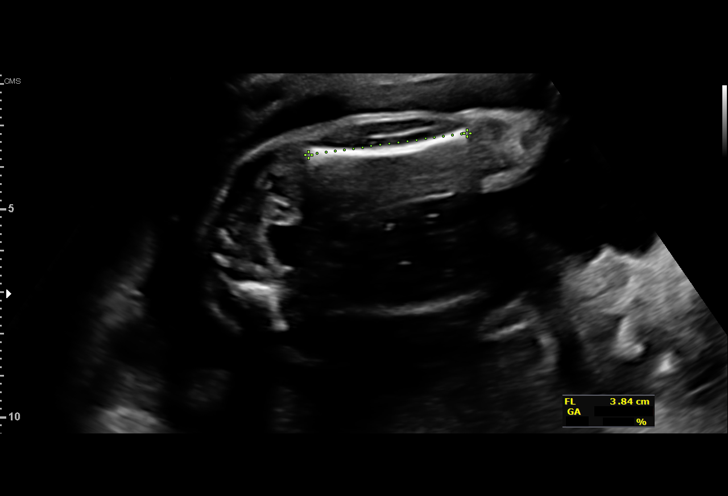
[im 41/62]
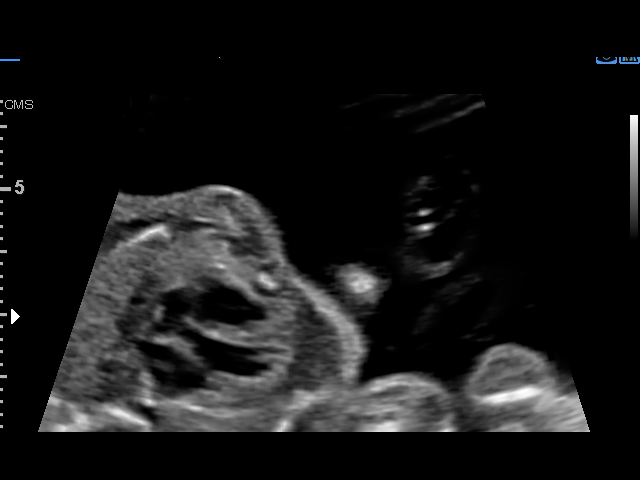
[im 46/62]
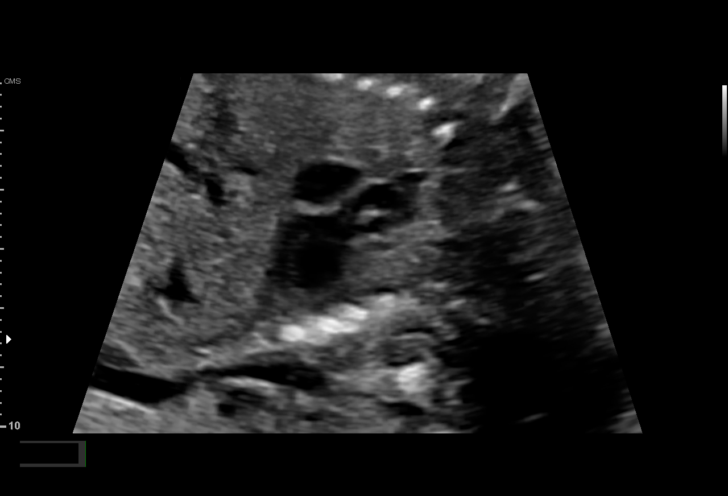
[im 50/62]
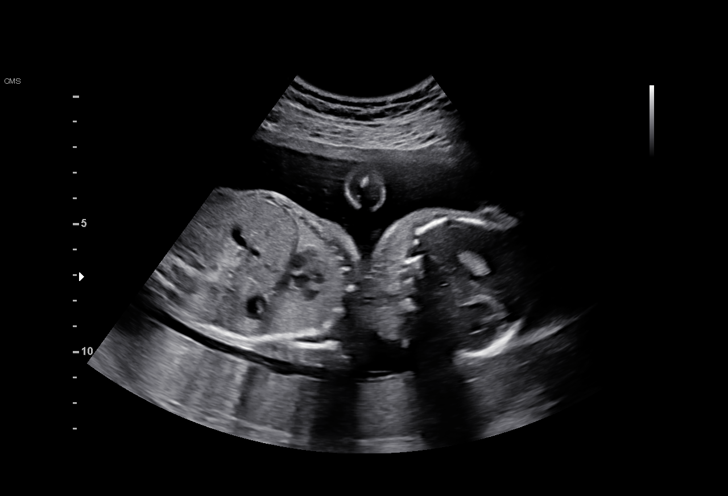
[im 55/62]
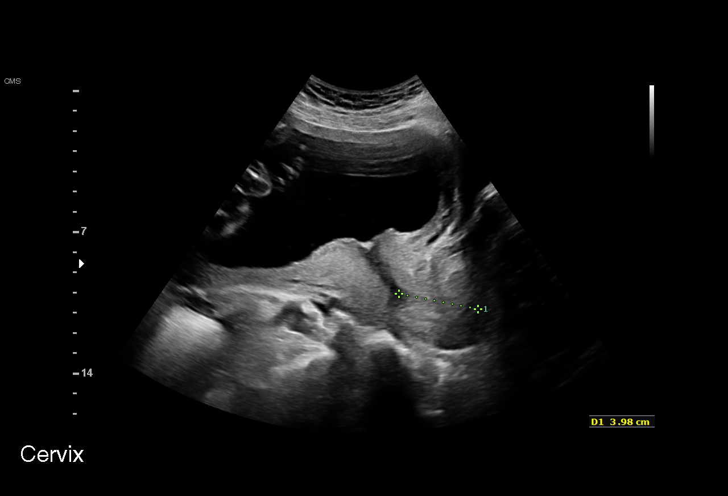
[im 59/62]
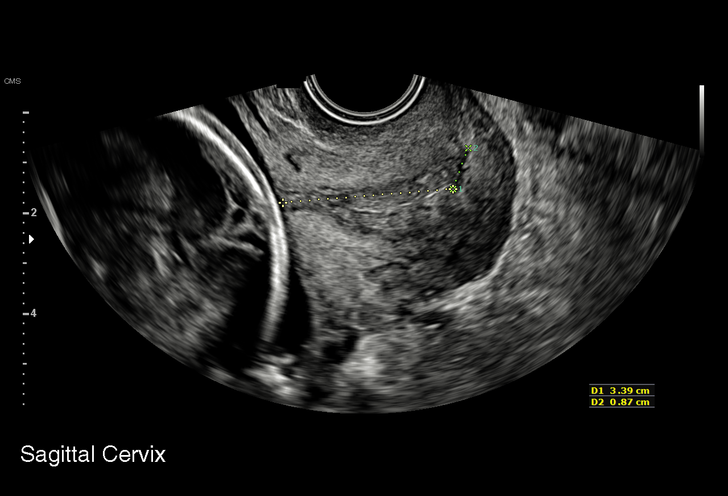

[13 of 28 positions shown; findings below may reference images not displayed]

Indications

 Encounter for cervical length
 Poor obstetric history: Previous preterm
 delivery, antepartum (@28 weeks due to
 placental abruption)
 22 weeks gestation of pregnancy
 Encounter for other antenatal screening
 follow-up
Vital Signs

                                                Height:        5'3"
Fetal Evaluation

 Num Of Fetuses:         1
 Fetal Heart Rate(bpm):  155
 Cardiac Activity:       Observed
 Presentation:           Variable
 Placenta:               Posterior
 P. Cord Insertion:      Previously Visualized

 Amniotic Fluid
 AFI FV:      Within normal limits

                             Largest Pocket(cm)

Biometry

 BPD:      54.7  mm     G. Age:  22w 5d         56  %    CI:        72.46   %    70 - 86
                                                         FL/HC:      18.5   %    18.4 -
 HC:      204.4  mm     G. Age:  22w 4d         42  %    HC/AC:      1.06        1.06 -
 AC:      193.3  mm     G. Age:  24w 0d         87  %    FL/BPD:     69.1   %    71 - 87
 FL:       37.8  mm     G. Age:  22w 1d         28  %    FL/AC:      19.6   %    20 - 24
 HUM:      37.6  mm     G. Age:  23w 1d         65  %

 Est. FW:     563  gm      1 lb 4 oz     77  %
OB History

 Gravidity:    2         Term:   0        Prem:   1
 Living:       1
Gestational Age

 LMP:           22w 3d        Date:  02/15/20                 EDD:   11/21/20
 U/S Today:     22w 6d                                        EDD:   11/18/20
 Best:          22w 3d     Det. By:  LMP  (02/15/20)          EDD:   11/21/20
Anatomy

 Cranium:               Appears normal         Aortic Arch:            Previously seen
 Cavum:                 Appears normal         Ductal Arch:            Previously seen
 Ventricles:            Appears normal         Diaphragm:              Appears normal
 Choroid Plexus:        Appears normal         Stomach:                Appears normal, left
                                                                       sided
 Cerebellum:            Appears normal         Abdomen:                Appears normal
 Posterior Fossa:       Previously seen        Abdominal Wall:         Appears nml (cord
                                                                       insert, abd wall)
 Nuchal Fold:           Previously seen        Cord Vessels:           Appears normal (3
                                                                       vessel cord)
 Face:                  Appears normal         Kidneys:                Appear normal
                        (orbits and profile)
 Lips:                  Previously seen        Bladder:                Appears normal
 Thoracic:              Appears normal         Spine:                  Previously seen
 Heart:                 Previously seen        Upper Extremities:      Previously seen
 RVOT:                  Previously seen        Lower Extremities:      Previously seen
 LVOT:                  Appears normal

 Other:  Heels and 5th digit previously visualized. Fetus appears to be a male.
         Open hands previously visualized. Nasal bone visualized.
Cervix Uterus Adnexa

 Cervix
 Length:            4.2  cm.
 Normal appearance by transvaginal scan

 Uterus
 No abnormality visualized.

 Right Ovary
 Not visualized.
 Left Ovary
 Not visualized.

 Cul De Sac
 No free fluid seen.

 Adnexa
 No abnormality visualized.
Impression

 Patient returns for fetal growth assessment and cervical
 length measurement.  Obstetric history significant for a
 preterm delivery at 28 weeks gestation possibly from
 placental abruption.  Patient had MFM consultation and a
 plan was made for cervical length measurement every 2
 weeks till 24 weeks gestation.

 Fetal growth is appropriate for gestational age .Amniotic fluid
 is normal and good fetal activity is seen .  We performed
 transvaginal ultrasound.  The cervix measures 4.2 cm, which
 is within normal limits.

 We reassured the patient of the findings.
Recommendations

 -An appointment was made for her to return in 8 weeks for
 fetal growth assessment (poor obstetric history).
                 Bushuev, Liusia
